# Patient Record
Sex: Female | Born: 1970 | Race: Black or African American | Hispanic: No | Marital: Single | Smoking: Never smoker
Health system: Southern US, Community
[De-identification: ages and names within clinical notes are randomized; demographics above are authoritative.]

## PROBLEM LIST (undated history)

## (undated) ENCOUNTER — Inpatient Hospital Stay (HOSPITAL_COMMUNITY): Payer: Self-pay

## (undated) DIAGNOSIS — B373 Candidiasis of vulva and vagina: Secondary | ICD-10-CM

## (undated) DIAGNOSIS — A599 Trichomoniasis, unspecified: Secondary | ICD-10-CM

## (undated) DIAGNOSIS — R87619 Unspecified abnormal cytological findings in specimens from cervix uteri: Secondary | ICD-10-CM

## (undated) DIAGNOSIS — H269 Unspecified cataract: Secondary | ICD-10-CM

## (undated) DIAGNOSIS — B3731 Acute candidiasis of vulva and vagina: Secondary | ICD-10-CM

## (undated) DIAGNOSIS — B009 Herpesviral infection, unspecified: Secondary | ICD-10-CM

## (undated) DIAGNOSIS — IMO0002 Reserved for concepts with insufficient information to code with codable children: Secondary | ICD-10-CM

## (undated) HISTORY — DX: Unspecified abnormal cytological findings in specimens from cervix uteri: R87.619

## (undated) HISTORY — PX: WISDOM TOOTH EXTRACTION: SHX21

## (undated) HISTORY — DX: Acute candidiasis of vulva and vagina: B37.31

## (undated) HISTORY — PX: DILATION AND CURETTAGE OF UTERUS: SHX78

## (undated) HISTORY — DX: Herpesviral infection, unspecified: B00.9

## (undated) HISTORY — DX: Trichomoniasis, unspecified: A59.9

## (undated) HISTORY — DX: Unspecified cataract: H26.9

## (undated) HISTORY — DX: Candidiasis of vulva and vagina: B37.3

## (undated) HISTORY — DX: Reserved for concepts with insufficient information to code with codable children: IMO0002

## (undated) HISTORY — PX: CATARACT EXTRACTION: SUR2

---

## 2000-05-19 ENCOUNTER — Other Ambulatory Visit: Admission: RE | Admit: 2000-05-19 | Discharge: 2000-05-19 | Payer: Self-pay | Admitting: Obstetrics and Gynecology

## 2000-10-10 ENCOUNTER — Inpatient Hospital Stay (HOSPITAL_COMMUNITY): Admission: AD | Admit: 2000-10-10 | Discharge: 2000-10-13 | Payer: Self-pay | Admitting: Obstetrics and Gynecology

## 2000-10-10 ENCOUNTER — Encounter (INDEPENDENT_AMBULATORY_CARE_PROVIDER_SITE_OTHER): Payer: Self-pay | Admitting: Specialist

## 2004-05-07 ENCOUNTER — Emergency Department (HOSPITAL_COMMUNITY): Admission: EM | Admit: 2004-05-07 | Discharge: 2004-05-07 | Payer: Self-pay | Admitting: Family Medicine

## 2006-02-22 ENCOUNTER — Other Ambulatory Visit: Admission: RE | Admit: 2006-02-22 | Discharge: 2006-02-22 | Payer: Self-pay | Admitting: Obstetrics and Gynecology

## 2008-08-20 ENCOUNTER — Emergency Department (HOSPITAL_COMMUNITY): Admission: EM | Admit: 2008-08-20 | Discharge: 2008-08-21 | Payer: Self-pay | Admitting: Emergency Medicine

## 2010-07-13 ENCOUNTER — Inpatient Hospital Stay (INDEPENDENT_AMBULATORY_CARE_PROVIDER_SITE_OTHER)
Admission: RE | Admit: 2010-07-13 | Discharge: 2010-07-13 | Disposition: A | Discharge status: Home / Self Care | Payer: Self-pay | Source: Ambulatory Visit | Attending: Family Medicine | Admitting: Family Medicine

## 2010-07-13 DIAGNOSIS — K5289 Other specified noninfective gastroenteritis and colitis: Secondary | ICD-10-CM

## 2010-10-03 NOTE — Op Note (Signed)
[facility]  Patient:    LUCERITO, ROSINSKI                        MRN: [id number] Proc. Date: 10/10/00 Adm. Date:  [id number] Attending:  Dierdre Forth [place]                           Operative Report  PREOPERATIVE DIAGNOSES:       1. Intrauterine pregnancy at term.                               2. Active labor.                               3. Prior cesarean section, desire for repeat                                  cesarean section.  POSTOPERATIVE DIAGNOSES:      1. Intrauterine pregnancy at term.                               2. Active labor.                               3. Prior cesarean section, desire for repeat                                  cesarean section.                               4. Macrosomia.  OPERATION:                    Repeat low transverse cesarean section.  SURGEON:                      Vanessa P. Pennie Rushing, M.D.  ASSISTANT:                    Mack Guise, C.N.M.  ANESTHESIA:                   Spinal.  ESTIMATED BLOOD LOSS:         Less than 750 cc.  COMPLICATIONS:                None.  FINDINGS:                     The patient was delivered of a female infant weighing 8 pounds and 14 ounces with Apgars of 6 and 8 at one and five minutes respectively.  There was a nuchal cord and a true knot in the cord.  The uterus, tubes, and ovaries appeared normal for the gravid state.  DESCRIPTION OF PROCEDURE:     The patient was taken to the operating room after appropriate identification and after having had a Foley catheter placed under sterile conditions in the maternity admissions unit.  She was placed on the operating table and a spinal anesthetic placed.  The  patient was then put in the supine position with a left lateral tilt.  The abdomen was prepped with multiple layers of Betadine, and then draped as a sterile field.  After the assurance of adequate anesthesia, a transverse incision was made in the abdomen  at the site of the previous cesarean section incision and the abdomen opened in the layers.  The peritoneum was entered.  The uterus was incised approximately 1 cm above the uterovesical fold, and the infant delivered from the occiput transverse position, having started out in the occiput posterior position.  The vertex was delivered with the aid of a Kiwi vacuum extractor. The nares and pharynx were suctioned with _______ suction.  Meconium-stained fluid had been noted upon entry into the uterus.  The remainder of the infant was delivered and the cord clamped and cut.  The infant was handed off to the awaiting pediatricians.  The appropriate cord blood was drawn and the placenta noted to have detached from the uterus, and was removed with gentle traction. The uterine incision was closed with a running interlocking suture of 0 Vicryl.  An imbricating suture of 0 Vicryl was then placed.  A figure-of-eight suture was placed for adequate hemostasis.  A second row of sutures were then placed in an imbricating fashion with 0 Vicryl.  Copious irrigation was carried out and hemostasis noted to be adequate.  The abdominal peritoneum was closed with a running suture of 2-0 Vicryl.  The rectus muscle was reapproximated in the midline with a figure-of-eight suture of 2-0 Vicryl.  The rectus muscles were irrigated and made hemostatic with the Bovie cautery.  The rectus fascia was closed with a running suture of 0 Vicryl and reinforced on either side of the midline with figure-of-eight sutures of 0 Vicryl.  The subcutaneous tissue was noted to be hemostatic after Bovie cautery and irrigated.  Skin staples were applied to the skin incision.  A sterile dressing was applied.  The patient was taken from the operating room to the recovery room in satisfactory condition having tolerated the procedure well with sponge and instrument counts correct.  The infant went to the full term nursery. DD:   10/10/00 TD:  10/11/00 Job: [id number]

## 2010-10-03 NOTE — Discharge Summary (Signed)
[facility]  Patient:    Linda Campbell, Linda Campbell                        MRN: [id number] Adm. Date:  [id number] Disc. Date: 10/13/00 Attending:  Shaune Spittle Dictator:   Mack Guise, C.N.M.                           Discharge Summary  ADMISSION DIAGNOSES:          1. Term pregnancy in labor.                               2. Prior cesarean section, desires repeat.  DISCHARGE DIAGNOSES:          1. Term pregnancy in labor.                               2. Prior cesarean section, desires repeat.                               3. Macrosomia.  PROCEDURE:                    Low transverse cesarean section.  HISTORY OF PRESENT ILLNESS:   Ms. Horsfall is a 40 year old, gravida 6, para 1-0-4-1, who presents at 40 weeks 1 day in early labor and a history of prior cesarean section requesting repeat.  HOSPITAL COURSE:              She underwent primary low transverse cesarean section by Maris Berger. Haygood, M.D. with the birth of an 8 pound 28 ounce female infant with Apgars of 6 at one minute and 8 at five minutes.  Her postoperative course has been unremarkable.  Her hemoglobin on the first postoperative day was 10.2 and on this, her third postoperative day, she is deemed to be in satisfactory condition for discharge.  DISCHARGE INSTRUCTIONS:       Per [facility] and Gynecology handout.  DISCHARGE MEDICATIONS:        1. Motrin 600 mg p.o. q.6h. p.r.n. pain.                               2. Tylox one to two p.o. q.3-4h. p.r.n. pain.  FOLLOW-UP:                    At [facility] Gyne[facility] in six weeks. DD:  10/13/00 TD:  10/13/00 Job: [id number]

## 2010-10-03 NOTE — H&P (Signed)
[facility]  Patient:    Linda Campbell, Linda Campbell                        MRN: [id number] Adm. Date:  [id number] Attending:  Shaune Spittle Dictator:   Mack Guise, C.N.M.                         History and Physical  HISTORY:                      Linda Campbell is a 40 year old gravida 6, para 1-0-4-1 at 27 1/7 weeks, EDD Oct 09, 2000 who presents with contractions increasing in frequency and intensity through the night.  She reports positive fetal movement.  No bleeding.  No rupture of membranes.  Denies any headache, visual changes, or epigastric pain.  Her pregnancy has been followed by the C.N.M. service at [facility] and is remarkable for previous cesarean section, history of HSV, four ABs, history of abnormal Pap smear, first trimester alcohol, group B strep negative.  This patient was initially evaluated at the office of [facility] on May 19, 2000 at approximately [redacted] weeks gestation.  EDC confirmed by pregnancy ultrasonography.  Patient had previously made decision to have VBAC trial with this pregnancy but on presenting in early labor wishes to repeat her cesarean section delivery.  She is willing to accept the risks of repeat cesarean section, but unwilling to accept the risks of a trial of labor.  Dr. Dierdre Forth was consulted and discussed these issues with the patient in detail and decision was made to proceed with cesarean delivery.  PRENATAL LABORATORIES:        On May 19, 2000:  Hemoglobin and hematocrit 12.2 and 36.3, platelets 230,000.  Blood type and Rh B+.  Antibody screen negative.  VDRL nonreactive.  Rubella immune.  Hepatitis B surface antigen negative.  HIV nonreactive.  Pap smear within normal limits.  GC and chlamydia negative.  AFP/free beta hCG within normal range on July 21, 2000.  One hour glucose challenge 107.  Hemoglobin 11.2.  At 36 weeks culture of the vaginal tract is negative for group B strep.           This patients pregnancy had been unremarkable with the growth of the uterine fundus being compatible in size with her dates throughout the pregnancy.  She has been normotensive with no significant proteinuria.  PAST OBSTETRICAL HISTORY:     1991 induced AB.  1993 induced AB.  1995 SAB with no complications.  In 1998 primary low transverse cesarean delivery of a 7 pounds 12 ounces female infant at 42 weeks for fetal distress.  Babys name is Biagio Borg.  In 2001 induced AB.  Present pregnancy.  PAST MEDICAL HISTORY:         HSV.  PAST SURGICAL HISTORY:        Cesarean section 1998.  FAMILY HISTORY:               Patients mother and maternal aunt with varicose veins.  Maternal uncle with lung CA.  GENETIC HISTORY:              There is no family history of familial or genetic disorders, children that died in infancy or that were born with birth defects.  ALLERGIES:                    No known  drug allergies.  HABITS:                       Patient denies the use of tobacco.  Had alcohol, one mixed drink, in the early trimester.  She has had no other alcoholic beverages since that time.  SOCIAL HISTORY:               Linda Campbell is a single 40 year old African-American female.  The father of the baby, Rupert Stacks, is involved and supportive.  They are of the nondenominational.  REVIEW OF SYSTEMS:            There are no signs or symptoms suggestive of focal or systemic disease.  Patient is typical of one with a uterine pregnancy at term in early labor.  PHYSICAL EXAMINATION  VITAL SIGNS:                  Vital signs stable, afebrile.  HEENT:                        Unremarkable.  NECK:                         Thyroid not enlarged.  HEART:                        Regular rate and rhythm.  LUNGS:                        Clear.  ABDOMEN:                      Gravid in its contour.  Uterine fundus is noted to extend 42 cm above the level of the pubic symphysis.  ______  maneuver finds the infant to be in a longitudinal lie, cephalic presentation, and the estimated fetal weight is 8.5 pounds.  PELVIC:                       Digital examination of the cervix finds it to be 1, thick, approximately 50% ______, and the vertex high and ballottable. Membranes are intact.  EXTREMITIES:                  DTRs are 1+ with no clonus.  There is no significant edema in the extremities.  ASSESSMENT:                   Intrauterine pregnancy at term, previous cesarean section requests repeat cesarean section, consult Dr. Pennie Rushing and admit.  Routine cesarean section orders. DD:  10/10/00 TD:  10/10/00 Job: [id number]

## 2011-07-12 ENCOUNTER — Inpatient Hospital Stay (HOSPITAL_COMMUNITY): Payer: Medicaid Other

## 2011-07-12 ENCOUNTER — Encounter (HOSPITAL_COMMUNITY): Payer: Self-pay

## 2011-07-12 ENCOUNTER — Inpatient Hospital Stay (HOSPITAL_COMMUNITY)
Admission: AD | Admit: 2011-07-12 | Discharge: 2011-07-12 | Disposition: A | Discharge status: Home / Self Care | Payer: Medicaid Other | Source: Ambulatory Visit | Attending: Obstetrics and Gynecology | Admitting: Obstetrics and Gynecology

## 2011-07-12 DIAGNOSIS — O209 Hemorrhage in early pregnancy, unspecified: Secondary | ICD-10-CM

## 2011-07-12 DIAGNOSIS — R109 Unspecified abdominal pain: Secondary | ICD-10-CM | POA: Insufficient documentation

## 2011-07-12 DIAGNOSIS — N898 Other specified noninflammatory disorders of vagina: Secondary | ICD-10-CM | POA: Insufficient documentation

## 2011-07-12 LAB — WET PREP, GENITAL
Clue Cells Wet Prep HPF POC: NONE SEEN
Trich, Wet Prep: NONE SEEN
WBC, Wet Prep HPF POC: NONE SEEN

## 2011-07-12 LAB — CBC
HCT: 34.3 % — ABNORMAL LOW (ref 36.0–46.0)
MCH: 30 pg (ref 26.0–34.0)
MCHC: 33.8 g/dL (ref 30.0–36.0)
MCV: 88.6 fL (ref 78.0–100.0)
RDW: 14.5 % (ref 11.5–15.5)

## 2011-07-12 LAB — ABO/RH: ABO/RH(D): B POS

## 2011-07-12 NOTE — Discharge Instructions (Signed)
Vaginal Bleeding During Pregnancy, First Trimester A small amount of bleeding (spotting) is relatively common in early pregnancy. It usually stops on its own. There are many causes for bleeding or spotting in early pregnancy. Some bleeding may be related to the pregnancy and some may not. Cramping with the bleeding is more serious and concerning. Tell your caregiver if you have any vaginal bleeding.  CAUSES   It is normal in most cases.   The pregnancy ends (miscarriage).   The pregnancy may end (threatened miscarriage).   Infection or inflammation of the cervix.   Growths (polyps) on the cervix.   Pregnancy happens outside of the uterus and in a fallopian tube (tubal pregnancy).   Many tiny cysts in the uterus instead of pregnancy tissue (molar pregnancy).  SYMPTOMS  Vaginal bleeding or spotting with or without cramps. DIAGNOSIS  To evaluate the pregnancy, your caregiver may:  Do a pelvic exam.   Take blood tests.   Do an ultrasound.  It is very important to follow your caregiver's instructions.  TREATMENT   Evaluation of the pregnancy with blood tests and ultrasound.   Bed rest (getting up to use the bathroom only).   Rho-gam immunization if the mother is Rh negative and the father is Rh positive.  HOME CARE INSTRUCTIONS   If your caregiver orders bed rest, you may need to make arrangements for the care of other children and for other responsibilities. However, your caregiver may allow you to continue light activity.   Keep track of the number of pads you use each day, how often you change pads and how soaked (saturated) they are. Write this down.   Do not use tampons. Do not douche.   Do not have sexual intercourse or orgasms until approved by your physician.   Save any tissue that you pass for your caregiver to see.   Take medicine for cramps only with your caregiver's permission.   Do not take aspirin because it can make you bleed.  SEEK IMMEDIATE MEDICAL CARE  IF:   You experience severe cramps in your stomach, back or belly (abdomen).   You have an oral temperature above 102 F (38.9 C), not controlled by medicine.   You pass large clots or tissue.   Your bleeding increases or you become light-headed, weak or have fainting episodes.   You develop chills.   You are leaking or have a gush of fluid from your vagina.   You pass out while having a bowel movement. That may mean you have a ruptured tubal pregnancy.  Document Released: 02/11/2005 Document Revised: 01/14/2011 Document Reviewed: 08/23/2008 Exit[employer] Patient Information 2012 [employer]. 

## 2011-07-12 NOTE — Progress Notes (Signed)
Patient is here with c/o vaginal bleeding and lower abdominal dull pain that started this afternoon. She states that she noticed the spotting after urinating, has no pad or pantiliner. Her last intercourse was a week ago. Patient denies dysuria. She states that she is in a lot of stress at home and its concerned that this is coming from that.

## 2011-07-12 NOTE — ED Provider Notes (Signed)
History   Pt presents today c/o lower abd cramping and vag bleeding. She states she first noticed some dark red bleeding when she was walking in the mall then slightly brighter red about ago. She denies fever, recent intercourse, or any other sx at this time.  Chief Complaint  Patient presents with  . Vaginal Bleeding   HPI  OB History    Grav Para Term Preterm Abortions TAB SAB Ect Mult Living   5 2 2  2 1 1   2       History reviewed. No pertinent past medical history.  Past Surgical History  Procedure Date  . Cesarean section     History reviewed. No pertinent family history.  History  Substance Use Topics  . Smoking status: Never Smoker   . Smokeless tobacco: Not on file  . Alcohol Use: No    Allergies: Allergies not on file  No prescriptions prior to admission    Review of Systems  Constitutional: Negative for fever and chills.  Eyes: Negative for blurred vision and double vision.  Respiratory: Negative for cough.   Cardiovascular: Negative for chest pain and palpitations.  Gastrointestinal: Positive for abdominal pain. Negative for nausea, vomiting, diarrhea and constipation.  Genitourinary: Negative for dysuria, urgency, frequency and hematuria.  Neurological: Negative for dizziness and headaches.  Psychiatric/Behavioral: Negative for depression and suicidal ideas.   Physical Exam   Blood pressure 108/76, pulse 76, temperature 98.6 F (37 C), temperature source Oral, resp. rate 16, last menstrual period 04/04/2011.  Physical Exam  Nursing note and vitals reviewed. Constitutional: She is oriented to person, place, and time. She appears well-developed and well-nourished. No distress.  HENT:  Head: Normocephalic and atraumatic.  Eyes: EOM are normal. Pupils are equal, round, and reactive to light.  GI: Soft. She exhibits no distension and no mass. There is no tenderness. There is no rebound and no guarding.  Genitourinary: There is bleeding around  the vagina. Vaginal discharge found.       Cervix Lg/closed.   Neurological: She is alert and oriented to person, place, and time.  Skin: Skin is warm and dry. She is not diaphoretic.  Psychiatric: She has a normal mood and affect. Her behavior is normal. Thought content normal.    MAU Course  Procedures  Wet prep and GC/Chlamydia cultures done.  Results for orders placed during the hospital encounter of 07/12/11 (from the past 24 hour(s))  ABO/RH     Status: Normal (Preliminary result)   Collection Time   07/12/11  1:41 AM      Component Value Range   ABO/RH(D) B POS    CBC     Status: Abnormal   Collection Time   07/12/11  1:41 AM      Component Value Range   WBC 6.0  4.0 - 10.5 (K/uL)   RBC 3.87  3.87 - 5.11 (MIL/uL)   Hemoglobin 11.6 (*) 12.0 - 15.0 (g/dL)   HCT 86.5 (*) 78.4 - 46.0 (%)   MCV 88.6  78.0 - 100.0 (fL)   MCH 30.0  26.0 - 34.0 (pg)   MCHC 33.8  30.0 - 36.0 (g/dL)   RDW 69.6  29.5 - 28.4 (%)   Platelets 221  150 - 400 (K/uL)  WET PREP, GENITAL     Status: Normal   Collection Time   07/12/11  1:58 AM      Component Value Range   Yeast Wet Prep HPF POC NONE SEEN  NONE SEEN  Trich, Wet Prep NONE SEEN  NONE SEEN    Clue Cells Wet Prep HPF POC NONE SEEN  NONE SEEN    WBC, Wet Prep HPF POC NONE SEEN  NONE SEEN    US Ob Comp Less 14 Wks  07/12/2011  *RADIOLOGY REPORT*  Clinical Data: bleeding; ;  OBSTETRIC <14 WK [place] AND TRANSVAGINAL OB US  Technique: Both transabdominal and transvaginal ultrasound examinations were performed for complete evaluation of the gestation as well as the maternal uterus, adnexal regions, and pelvic cul-de-sac.  Comparison: None  Findings: There is a single intrauterine gestation.  Mean sac diameter is 2.7 cm for an estimated gestational age of [redacted] weeks 6 days.  No yolk sac or fetal pole is visualized.  Small subchorionic hemorrhage is noted.  Multiple fibroids, the largest measuring up to 1.6 cm.  Small left corpus luteal cyst.  Right adnexa  unremarkable.  No free fluid.  IMPRESSION: Intrauterine pregnancy, 7 weeks 6 days by mean sac diameter. However, no yolk sac or fetal pole are visualized.  Findings concerning for anembryonic pregnancy.  Consider follow-up with serial quantitative beta HCG and ultrasounds to confirm.  Original Report Authenticated By: Cyndie Chime, M.D.   US Ob Transvaginal  07/12/2011  *RADIOLOGY REPORT*  Clinical Data: bleeding; ;  OBSTETRIC <14 WK [place] AND TRANSVAGINAL OB US  Technique: Both transabdominal and transvaginal ultrasound examinations were performed for complete evaluation of the gestation as well as the maternal uterus, adnexal regions, and pelvic cul-de-sac.  Comparison: None  Findings: There is a single intrauterine gestation.  Mean sac diameter is 2.7 cm for an estimated gestational age of [redacted] weeks 6 days.  No yolk sac or fetal pole is visualized.  Small subchorionic hemorrhage is noted.  Multiple fibroids, the largest measuring up to 1.6 cm.  Small left corpus luteal cyst.  Right adnexa unremarkable.  No free fluid.  IMPRESSION: Intrauterine pregnancy, 7 weeks 6 days by mean sac diameter. However, no yolk sac or fetal pole are visualized.  Findings concerning for anembryonic pregnancy.  Consider follow-up with serial quantitative beta HCG and ultrasounds to confirm.  Original Report Authenticated By: Cyndie Chime, M.D.     Assessment and Plan  Bleeding in preg: discussed with pt at length. Will draw B-quant today and will repeat in 2 days. Discussed diet, activity, risks, and precautions.   Clinton Gallant. Lillian Tigges III, DrHSc, MPAS, PA-C  07/12/2011, 1:32 AM   Henrietta Hoover, PA 07/12/11 [phone]

## 2011-07-13 NOTE — ED Provider Notes (Signed)
Agree with above note.  Linda Campbell 07/13/2011 11:49 AM

## 2011-07-14 ENCOUNTER — Inpatient Hospital Stay (HOSPITAL_COMMUNITY)
Admission: AD | Admit: 2011-07-14 | Discharge: 2011-07-14 | Disposition: A | Discharge status: Home / Self Care | Payer: Medicaid Other | Source: Ambulatory Visit | Attending: Family Medicine | Admitting: Family Medicine

## 2011-07-14 DIAGNOSIS — O020 Blighted ovum and nonhydatidiform mole: Secondary | ICD-10-CM

## 2011-07-14 DIAGNOSIS — O0289 Other abnormal products of conception: Secondary | ICD-10-CM

## 2011-07-14 DIAGNOSIS — O209 Hemorrhage in early pregnancy, unspecified: Secondary | ICD-10-CM | POA: Insufficient documentation

## 2011-07-14 LAB — HCG, QUANTITATIVE, PREGNANCY: hCG, Beta Chain, Quant, S: 2115 m[IU]/mL — ABNORMAL HIGH (ref <5)

## 2011-07-14 MED ORDER — HYDROCODONE-ACETAMINOPHEN 5-500 MG PO TABS: 1.0000 | ORAL_TABLET | Freq: Four times a day (QID) | ORAL | Status: AC | PRN

## 2011-07-14 MED ORDER — PROMETHAZINE HCL 25 MG PO TABS: 25.0000 mg | ORAL_TABLET | Freq: Four times a day (QID) | ORAL | Status: AC | PRN

## 2011-07-14 MED ORDER — MISOPROSTOL 200 MCG PO TABS: ORAL_TABLET | ORAL | Status: DC

## 2011-07-14 NOTE — ED Provider Notes (Signed)
History   Pt presents today for repeat B-quant secondary to bleeding in preg with suspected anembryonic preg. She states she is doing well and denies any pain at this time. She continues to have mild vag spotting.  Chief Complaint  Patient presents with  . Follow-up   HPI  OB History    Grav Para Term Preterm Abortions TAB SAB Ect Mult Living   5 2 2  2 1 1   2       No past medical history on file.  Past Surgical History  Procedure Date  . Cesarean section     No family history on file.  History  Substance Use Topics  . Smoking status: Never Smoker   . Smokeless tobacco: Not on file  . Alcohol Use: No    Allergies: No Known Allergies  Prescriptions prior to admission  Medication Sig Dispense Refill  . acetaminophen (TYLENOL) 325 MG tablet Take 325 mg by mouth every 6 (six) hours as needed. For pain      . Prenatal Vit-Fe Fumarate-FA (PRENATAL MULTIVITAMIN) TABS Take 1 tablet by mouth daily.        Review of Systems  Constitutional: Negative for fever and chills.  Eyes: Negative for blurred vision and double vision.  Respiratory: Negative for cough, hemoptysis, sputum production, shortness of breath and wheezing.   Cardiovascular: Negative for chest pain and palpitations.  Gastrointestinal: Negative for nausea, vomiting, abdominal pain, diarrhea and constipation.  Genitourinary: Negative for dysuria, urgency, frequency and hematuria.  Neurological: Negative for dizziness and headaches.  Psychiatric/Behavioral: Negative for depression and suicidal ideas.   Physical Exam   Blood pressure 111/68, pulse 95, temperature 98 F (36.7 C), temperature source Oral, resp. rate 16, last menstrual period 04/04/2011, SpO2 100.00%.  Physical Exam  Nursing note and vitals reviewed. Constitutional: She is oriented to person, place, and time. She appears well-developed and well-nourished. No distress.  HENT:  Head: Normocephalic and atraumatic.  Eyes: EOM are normal. Pupils  are equal, round, and reactive to light.  GI: Soft. She exhibits no distension and no mass. There is no tenderness. There is no rebound and no guarding.  Neurological: She is alert and oriented to person, place, and time.  Skin: Skin is warm and dry. She is not diaphoretic.  Psychiatric: She has a normal mood and affect. Her behavior is normal. Judgment and thought content normal.    MAU Course  Procedures  Results for orders placed during the hospital encounter of 07/14/11 (from the past 72 hour(s))  HCG, QUANTITATIVE, PREGNANCY     Status: Abnormal   Collection Time   07/14/11  3:44 PM      Component Value Range Comment   hCG, Beta Chain, Quant, S 2115 (*) <5 (mIU/mL)      Assessment and Plan  Anembryonic preg: discussed with pt at length. Discussed tx options including expectant management vs cytotec. Pt desires cytotec. Will give Rx for cytotec, lortab, and phenergan. Discussed possibility of heavy vag bleeding and indications for return to the MAU. Discussed diet, activity, risks, and precautions.  Clinton Gallant. Sherilynn Dieu III, DrHSc, MPAS, PA-C  07/14/2011, 5:39 PM   Henrietta Hoover, PA 07/14/11 1746

## 2011-07-14 NOTE — ED Provider Notes (Signed)
Chart reviewed and agree with management and plan.  

## 2011-07-14 NOTE — ED Notes (Signed)
Pt discharged home with instructions given by E. Rice PA. Pt also discharged  With comfort materials poem, resources and pilow.

## 2011-07-14 NOTE — Progress Notes (Signed)
Patient to MAU for repeat BHCG. Denies any pain but has light spotting.

## 2011-09-21 ENCOUNTER — Encounter: Payer: Self-pay | Admitting: Obstetrics and Gynecology

## 2011-09-30 ENCOUNTER — Ambulatory Visit (INDEPENDENT_AMBULATORY_CARE_PROVIDER_SITE_OTHER): Payer: Medicaid Other | Admitting: Obstetrics and Gynecology

## 2011-09-30 ENCOUNTER — Encounter: Payer: Self-pay | Admitting: Obstetrics and Gynecology

## 2011-09-30 VITALS — BP 100/70 | Temp 98.2°F | Resp 16 | Ht 60.0 in | Wt 150.0 lb

## 2011-09-30 DIAGNOSIS — Z309 Encounter for contraceptive management, unspecified: Secondary | ICD-10-CM

## 2011-09-30 DIAGNOSIS — Z0000 Encounter for general adult medical examination without abnormal findings: Secondary | ICD-10-CM

## 2011-09-30 DIAGNOSIS — IMO0001 Reserved for inherently not codable concepts without codable children: Secondary | ICD-10-CM | POA: Insufficient documentation

## 2011-09-30 DIAGNOSIS — IMO0002 Reserved for concepts with insufficient information to code with codable children: Secondary | ICD-10-CM

## 2011-09-30 DIAGNOSIS — B009 Herpesviral infection, unspecified: Secondary | ICD-10-CM | POA: Insufficient documentation

## 2011-09-30 MED ORDER — VALACYCLOVIR HCL 1 G PO TABS: 1000.0000 mg | ORAL_TABLET | Freq: Two times a day (BID) | ORAL | Status: AC

## 2011-09-30 NOTE — Patient Instructions (Addendum)

## 2011-09-30 NOTE — Progress Notes (Signed)
Last Pap: per pt  06/2011 "at [facility] hospital" pt had a miscarriage in Feb.2013.  Treated with cytotec with passage of tissue WNL: Yes Regular Periods:yes Contraception: Condoms  Monthly Breast exam:no Tetanus<62yrs:no Nl.Bladder Function:yes Daily BMs:yes Healthy Diet:no Calcium:no Mammogram:no Exercise:yes Seatbelt: yes Abuse at home: no Stressful work:no Sigmoid-colonoscopy: per pt never Bone Density: No  Subjective:    Linda Campbell is a 41 y.o. female, [id number], who presents for an annual exam, and to discuss IUD     History   Social History  . Marital Status: Single    Spouse Name: N/A    Number of Children: N/A  . Years of Education: N/A   Social History Main Topics  . Smoking status: Never Smoker   . Smokeless tobacco: None  . Alcohol Use: No  . Drug Use: No  . Sexually Active: Yes    Birth Control/ Protection: Condom   Other Topics Concern  . None   Social History Narrative  . None    Menstrual cycle:   LMP: Patient's last menstrual period was 09/19/2010.           Cycle: Regular, monthly with normal flow and no severe dysmenorrha  The following portions of the patient's history were reviewed and updated as appropriate: allergies, current medications, past family history, past medical history, past social history, past surgical history and problem list.  Review of Systems Pertinent items are noted in HPI. 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 No    Weight:  Wt Readings from Last 1 Encounters:  09/30/11 150 lb (68.04 kg)          BMI: Body mass index is 29.30 kg/(m^2).  General Appearance: Alert, appropriate  appearance for age. No acute distress HEENT: Grossly normal Neck / Thyroid: Supple, no masses, nodes or enlargement Lungs: clear to auscultation bilaterally Back: No CVA tenderness Breast Exam: No masses or nodes.No dimpling, nipple retraction or discharge. Cardiovascular: Regular rate and rhythm. S1, S2, no murmur Gastrointestinal: Soft, non-tender, no masses or organomegaly Pelvic Exam: External genitalia: normal general appearance Vaginal: normal mucosa without prolapse or lesions Cervix: normal appearance Adnexa: normal bimanual exam Uterus: irregular enlargement and 8-10 wks size Rectovaginal: normal rectal, no masses Lymphatic Exam: Non-palpable nodes in neck, clavicular, axillary, or inguinal regions Skin: no rash or abnormalities Neurologic: Normal gait and speech, no tremor  Psychiatric: Alert and oriented, appropriate affect.   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

## 2011-10-01 LAB — GC/CHLAMYDIA PROBE AMP, GENITAL
Chlamydia, DNA Probe: NEGATIVE
GC Probe Amp, Genital: NEGATIVE

## 2011-11-02 ENCOUNTER — Encounter: Payer: Self-pay | Admitting: Obstetrics and Gynecology

## 2011-11-02 ENCOUNTER — Ambulatory Visit (INDEPENDENT_AMBULATORY_CARE_PROVIDER_SITE_OTHER): Payer: Medicaid Other | Admitting: Obstetrics and Gynecology

## 2011-11-02 VITALS — BP 94/62 | HR 60 | Wt 150.0 lb

## 2011-11-02 DIAGNOSIS — D759 Disease of blood and blood-forming organs, unspecified: Secondary | ICD-10-CM

## 2011-11-02 DIAGNOSIS — B009 Herpesviral infection, unspecified: Secondary | ICD-10-CM | POA: Insufficient documentation

## 2011-11-02 DIAGNOSIS — R6889 Other general symptoms and signs: Secondary | ICD-10-CM

## 2011-11-02 DIAGNOSIS — IMO0002 Reserved for concepts with insufficient information to code with codable children: Secondary | ICD-10-CM | POA: Insufficient documentation

## 2011-11-02 DIAGNOSIS — Z30430 Encounter for insertion of intrauterine contraceptive device: Secondary | ICD-10-CM

## 2011-11-02 DIAGNOSIS — Z309 Encounter for contraceptive management, unspecified: Secondary | ICD-10-CM

## 2011-11-02 DIAGNOSIS — B373 Candidiasis of vulva and vagina: Secondary | ICD-10-CM | POA: Insufficient documentation

## 2011-11-02 DIAGNOSIS — B3731 Acute candidiasis of vulva and vagina: Secondary | ICD-10-CM | POA: Insufficient documentation

## 2011-11-02 DIAGNOSIS — IMO0001 Reserved for inherently not codable concepts without codable children: Secondary | ICD-10-CM

## 2011-11-02 LAB — POCT URINE PREGNANCY: Preg Test, Ur: NEGATIVE

## 2011-11-02 MED ORDER — LEVONORGESTREL 20 MCG/24HR IU IUD
INTRAUTERINE_SYSTEM | Freq: Once | INTRAUTERINE | Status: AC
  Administered 2011-11-02: 1 via INTRAUTERINE

## 2011-11-02 NOTE — Addendum Note (Signed)
Addended byWinfred Leeds on: 11/02/2011 12:23 PM   Modules accepted: Orders

## 2011-11-02 NOTE — Progress Notes (Signed)
IUD INSERTION NOTE  Levette A Caslin is a 41 y.o. female [phone] who presents for IUD insertion.  Patient used cytotec 400 mcg prior to procedure.  Consent signed after risks and benefits were reviewed including but not limited to bleeding, infection, expulsion and risk of uterine perforation that may require an additional procedure for removal.  LMP: Patient's last menstrual period was 10/22/2011. UPT: negative  MIRENA LOT NUMBER:[id number]   Prepped with Betadine Tenaculum placed on anterior lip of cervix after Hurricane gel was applied Uterus sounded at  7 cm Insertion of MIRENA IUD per protocol without any complications   Assessment:  IUD Insertion                        Previous history of IUD  Plan:  1. Patient instructed to call with oral temperature of 100.4 degrees Fahrenheit or more, excessive bleeding or pain that is not relieved with OTC analgesia taken as directed  2. Patient instructed on how  to check IUD strings and encouraged to do so after each menstrual cycle  3. Advised not to place anything in vagina or have sexual intercourse for 7 days  4. Follow-up: 4 weeks   Karlyn Glasco PA-C 11/02/2011 10:25 AM

## 2011-11-02 NOTE — Patient Instructions (Signed)
Keep appointment for follow up in 4 weeks  Call [facility] [phone]:  -for temperature of 100.4 degrees Fahrenheit or more -pain not improved with over the counter pain medications (Ibuprofen, Advil, Aleve,        Tylenol or acetaminophen) -for excessive bleeding (more than a usual period) -for any other concerns  Do not place anything in your vagina for the next 7 days  

## 2011-11-30 ENCOUNTER — Ambulatory Visit (INDEPENDENT_AMBULATORY_CARE_PROVIDER_SITE_OTHER): Payer: Medicaid Other | Admitting: Obstetrics and Gynecology

## 2011-11-30 ENCOUNTER — Encounter: Payer: Self-pay | Admitting: Obstetrics and Gynecology

## 2011-11-30 VITALS — BP 100/62 | HR 70 | Wt 145.0 lb

## 2011-11-30 DIAGNOSIS — Z30431 Encounter for routine checking of intrauterine contraceptive device: Secondary | ICD-10-CM

## 2011-11-30 LAB — POCT URINE PREGNANCY: Preg Test, Ur: NEGATIVE

## 2011-11-30 NOTE — Progress Notes (Signed)
41 YO with recent IUD insertion returns for follow up.  Denies any complaints except that partner feels string-request trim.  O:  UPT-negative  Pelvic: EGBUS-wnl, vagina-scant brown discharge,  cervix-string visible-trimmed to 1 cm, uterus-no tenderness, adnexae-no masses or tenderness  A: IUD Check-string trimmed  P: RTO-AEx or prn  Erendira Crabtree, PA-C

## 2013-07-06 IMAGING — US US OB COMP LESS 14 WK
1 series · 14 of 28 positions shown · non-contrast
Comparison: None

CLINICAL DATA: bleeding; ;

OBSTETRIC <14 WK US AND TRANSVAGINAL OB US
TECHNIQUE: Both transabdominal and transvaginal ultrasound
examinations were performed for complete evaluation of the
gestation as well as the maternal uterus, adnexal regions, and
pelvic cul-de-sac.

[Series 1: us ob comp +14 wk · 47 acquisitions, 14 frames shown]
[im 2/47]
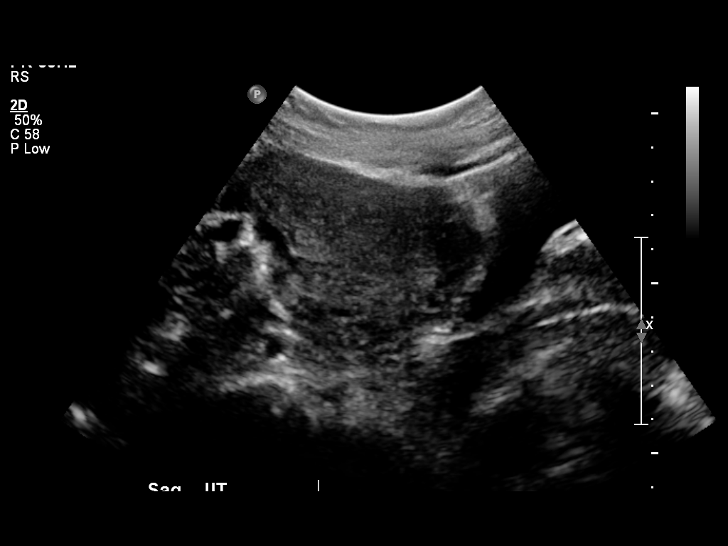
[im 6/47]
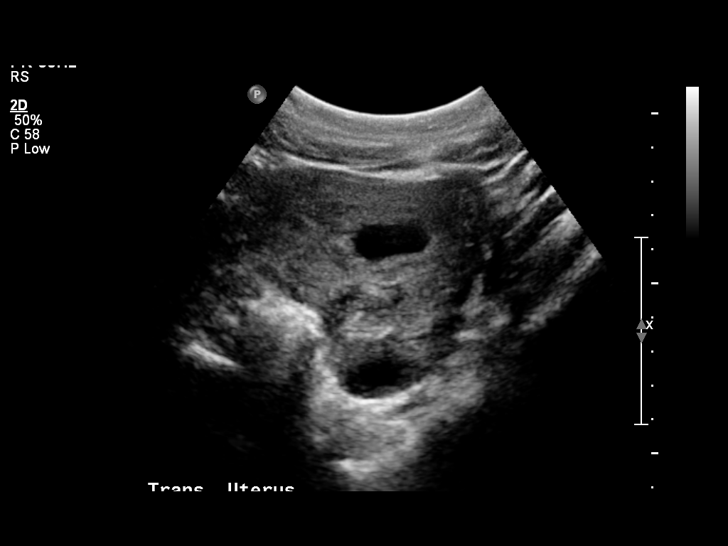
[im 9/47]
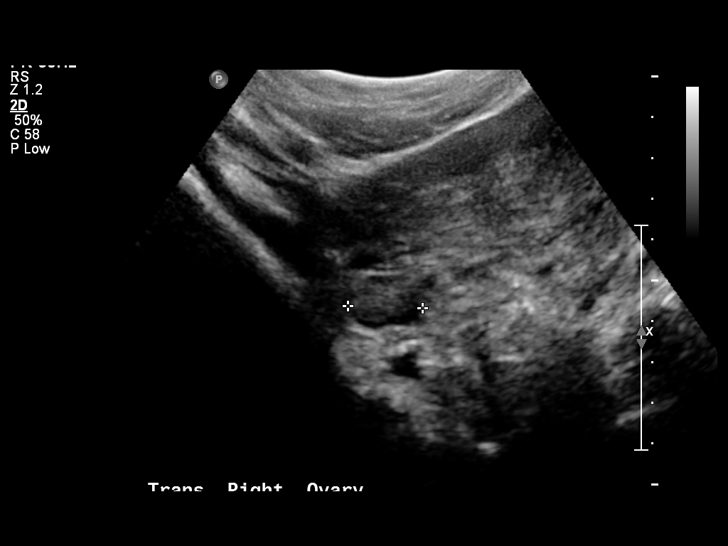
[im 12/47]
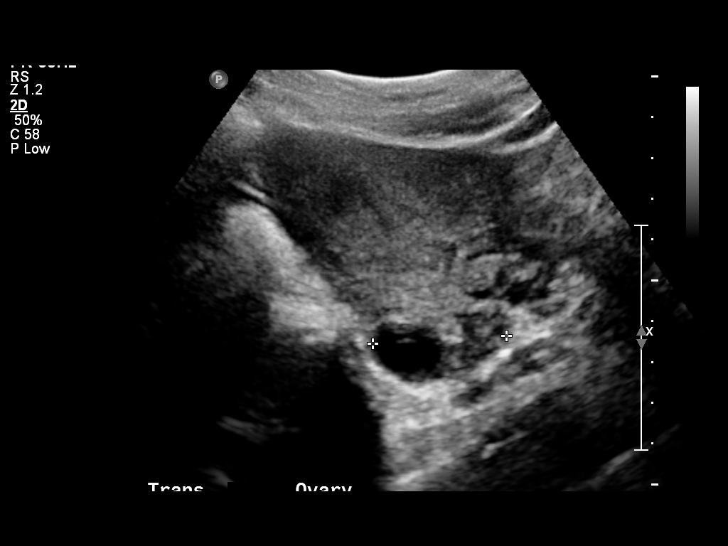
[im 16/47]
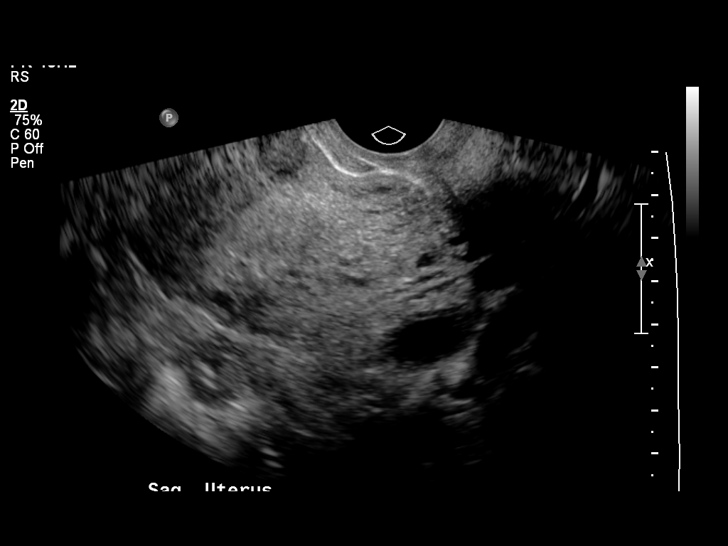
[im 19/47]
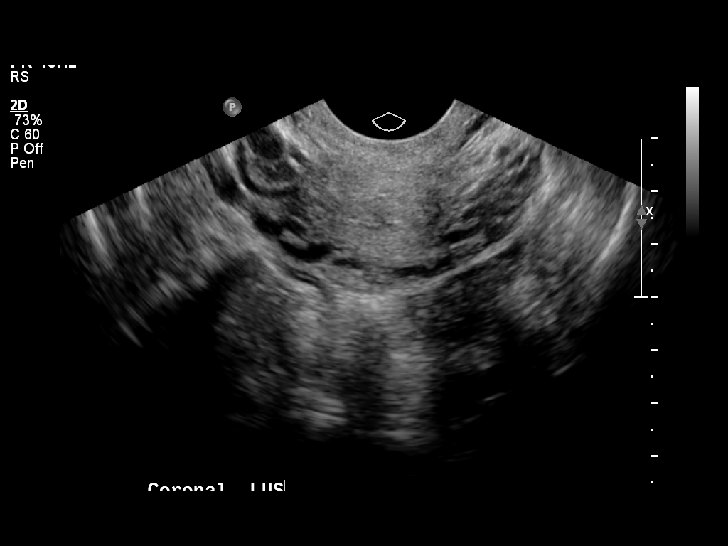
[im 23/47]
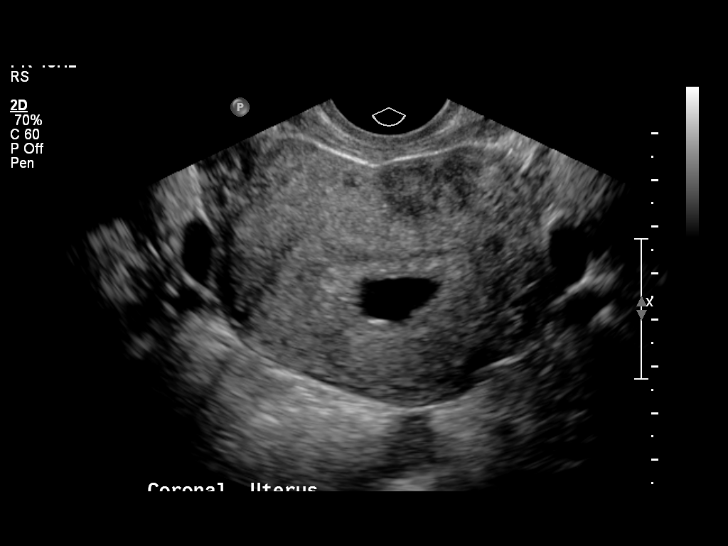
[im 26/47]
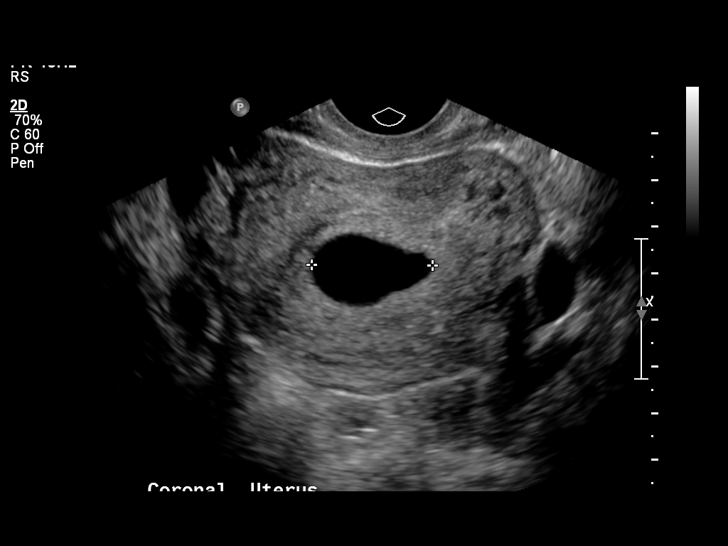
[im 29/47]
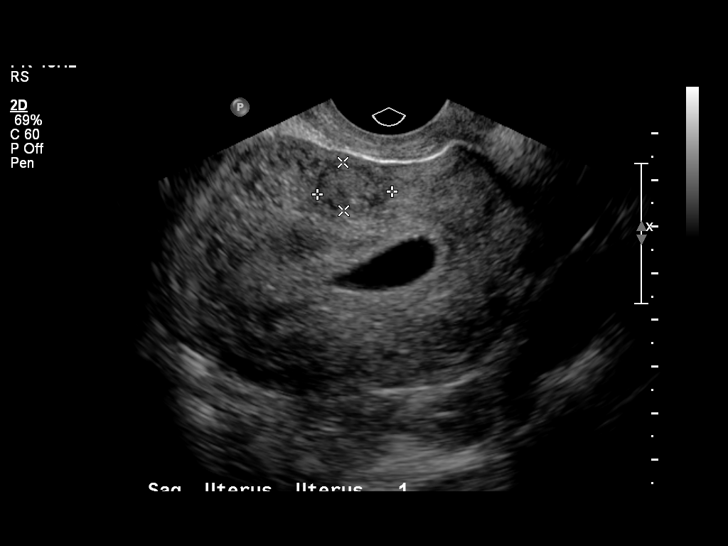
[im 33/47]
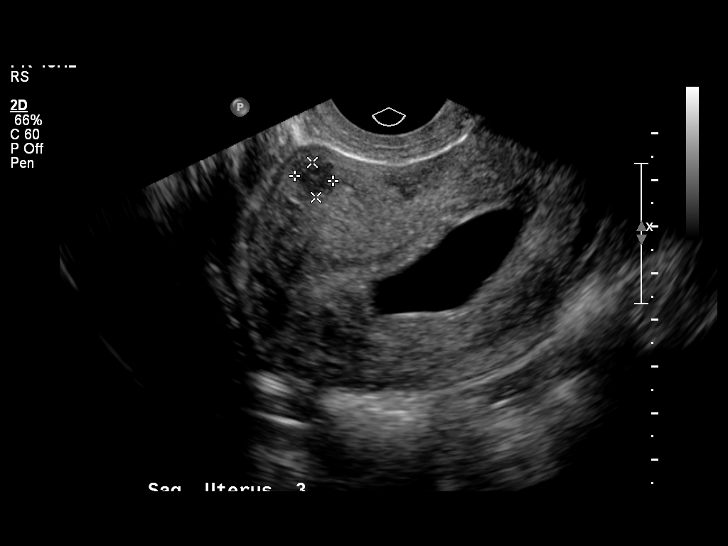
[im 36/47]
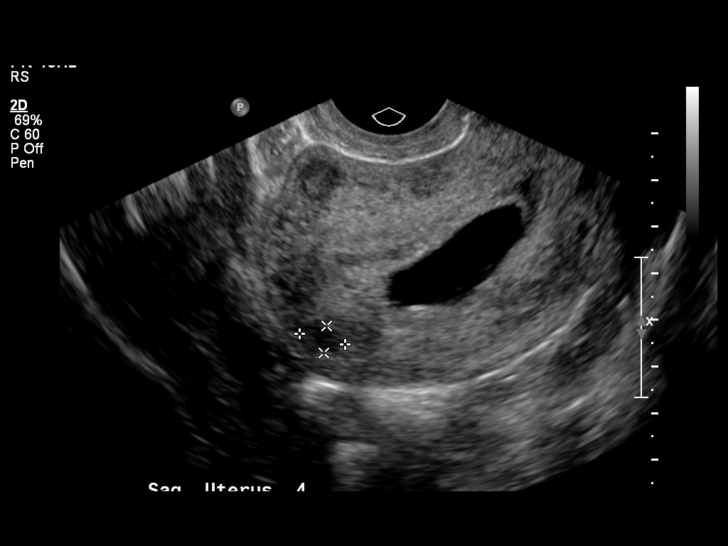
[im 40/47]
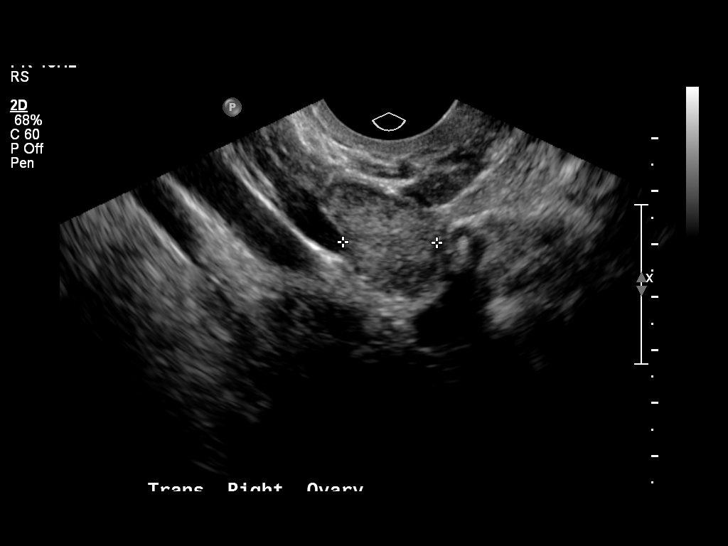
[im 43/47]
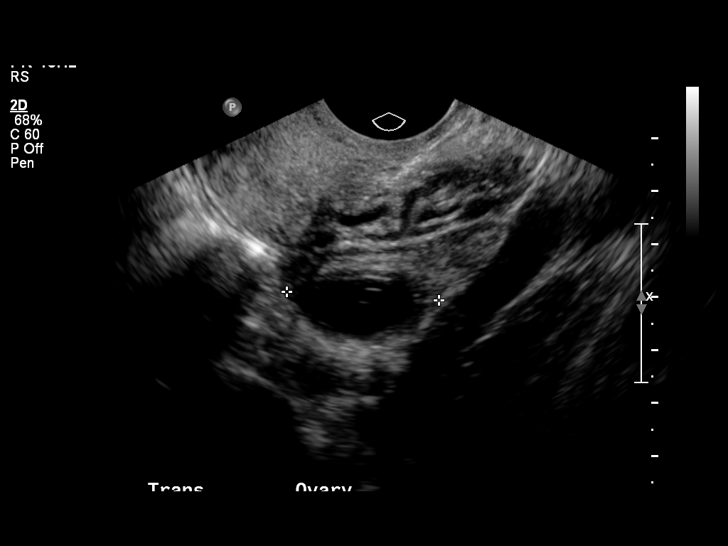
[im 47/47]
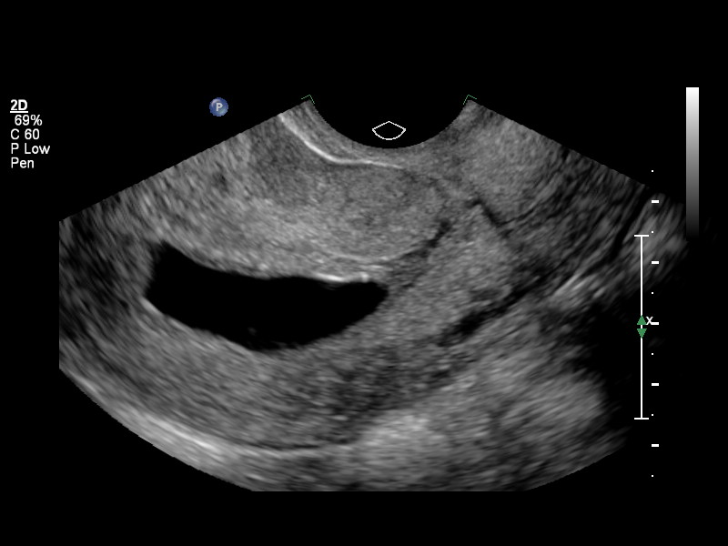

[14 of 28 positions shown; findings below may reference images not displayed]

FINDINGS: There is a single intrauterine gestation.  Mean sac
diameter is 2.7 cm for an estimated gestational age of 7 weeks 6
days.  No yolk sac or fetal pole is visualized.

Small subchorionic hemorrhage is noted.  Multiple fibroids, the
largest measuring up to 1.6 cm.

Small left corpus luteal cyst.  Right adnexa unremarkable.  No free
fluid.
IMPRESSION: Intrauterine pregnancy, 7 weeks 6 days by mean sac diameter.
However, no yolk sac or fetal pole are visualized.  Findings
concerning for anembryonic pregnancy.  Consider follow-up with
serial quantitative beta HCG and ultrasounds to confirm.

## 2014-03-19 ENCOUNTER — Encounter: Payer: Self-pay | Admitting: Obstetrics and Gynecology

## 2016-01-20 ENCOUNTER — Encounter (HOSPITAL_COMMUNITY): Payer: Self-pay | Admitting: *Deleted

## 2016-01-20 ENCOUNTER — Ambulatory Visit (HOSPITAL_COMMUNITY)
Admission: EM | Admit: 2016-01-20 | Discharge: 2016-01-20 | Disposition: A | Discharge status: Home / Self Care | Payer: Medicaid Other | Attending: Family Medicine | Admitting: Family Medicine

## 2016-01-20 DIAGNOSIS — B349 Viral infection, unspecified: Secondary | ICD-10-CM

## 2016-01-20 DIAGNOSIS — K2970 Gastritis, unspecified, without bleeding: Secondary | ICD-10-CM

## 2016-01-20 LAB — POCT I-STAT, CHEM 8
BUN: 16 mg/dL (ref 6–20)
CALCIUM ION: 1.22 mmol/L (ref 1.15–1.40)
CHLORIDE: 100 mmol/L — AB (ref 101–111)
CREATININE: 0.7 mg/dL (ref 0.44–1.00)
GLUCOSE: 104 mg/dL — AB (ref 65–99)
HCT: 45 % (ref 36.0–46.0)
Hemoglobin: 15.3 g/dL — ABNORMAL HIGH (ref 12.0–15.0)
Potassium: 3.7 mmol/L (ref 3.5–5.1)
Sodium: 138 mmol/L (ref 135–145)
TCO2: 25 mmol/L (ref 0–100)

## 2016-01-20 MED ORDER — ONDANSETRON 4 MG PO TBDP
4.0000 mg | ORAL_TABLET | Freq: Once | ORAL | Status: AC
  Administered 2016-01-20: 4 mg via ORAL

## 2016-01-20 MED ORDER — ONDANSETRON HCL 4 MG PO TABS: 4.0000 mg | ORAL_TABLET | Freq: Four times a day (QID) | ORAL | 0 refills | Status: DC

## 2016-01-20 MED ORDER — ACETAMINOPHEN 325 MG PO TABS
650.0000 mg | ORAL_TABLET | Freq: Once | ORAL | Status: AC
  Administered 2016-01-20: 650 mg via ORAL

## 2016-01-20 MED ORDER — ACETAMINOPHEN 325 MG PO TABS
ORAL_TABLET | ORAL | Status: AC
  Filled 2016-01-20: qty 2

## 2016-01-20 MED ORDER — ONDANSETRON 4 MG PO TBDP
ORAL_TABLET | ORAL | Status: AC
  Filled 2016-01-20: qty 1

## 2016-01-20 NOTE — ED Notes (Signed)
C/O stomach ache, generalized body aches, continued nausea.  Acetaminophen provided for comfort.  Instructed on small, frequent sips of PO fluids.  Instructed to go to ED if unable to keep small frequent sips of fluids down or if abd pain worsens or localizes.  Pt & family member state they are comfortable going home @ this time.

## 2016-01-20 NOTE — ED Triage Notes (Signed)
Pt     Reports    Vomiting     Chills   And  Body  Aches     With  Onset  Of  Symptoms      X  2  Days    Pt  Reports    Vomited  X  3  As  Well

## 2016-01-20 NOTE — ED Provider Notes (Signed)
CSN: [id number]     Arrival date & time 01/20/16  1014 History   First MD Initiated Contact with Patient 01/20/16 1137     Chief Complaint  Patient presents with  . Generalized Body Aches   (Consider location/radiation/quality/duration/timing/severity/associated sxs/prior Treatment) 45 year old female presents to the urgent care stating that yesterday morning she developed chills, body aches, myalgias and "lots of vomiting". She states she has vomited about 17 times over the past 1-1/2 days. She is vomited 4 times this morning with the last episode around 7:00. She is complaining of generalized abdominal pain that radiates to the back. States she has had normal bowel movements in the last few days. Denies diarrhea. Denies upper respiratory symptoms, sore throat earache, cough or shortness of breath.  She states last evening she was sitting on the toilet and she had a syncopal episode and she is woke a little later on the floor.      Past Medical History:  Diagnosis Date  . Abnormal Pap smear "1998"  . Herpes   . Trichomonas   . Vaginal yeast infection    Past Surgical History:  Procedure Laterality Date  . CESAREAN SECTION  1998 and 2002  . DILATION AND CURETTAGE OF UTERUS    . WISDOM TOOTH EXTRACTION     Family History  Problem Relation Age of Onset  . Alzheimer's disease Maternal Grandfather    Social History  Substance Use Topics  . Smoking status: Never Smoker  . Smokeless tobacco: Never Used  . Alcohol use Yes     Comment: social   OB History    Gravida Para Term Preterm AB Living   6 2 2   2 2    SAB TAB Ectopic Multiple Live Births   1 1           Review of Systems  Constitutional: Positive for activity change and chills. Negative for fever.  HENT: Negative.   Eyes: Negative.   Respiratory: Negative for cough, chest tightness and shortness of breath.   Cardiovascular: Negative for chest pain and leg swelling.  Gastrointestinal: Positive for abdominal pain,  nausea and vomiting. Negative for blood in stool, constipation and diarrhea.  Genitourinary: Negative.  Negative for dysuria, frequency and pelvic pain.  Musculoskeletal: Positive for back pain and myalgias. Negative for joint swelling.  Skin: Negative.   Neurological: Positive for syncope. Negative for tremors, seizures, facial asymmetry, speech difficulty and numbness.  Psychiatric/Behavioral: Negative.     Allergies  Other; Shellfish allergy; and Pollen extract  Home Medications   Prior to Admission medications   Medication Sig Start Date End Date Taking? Authorizing Provider  acetaminophen (TYLENOL) 325 MG tablet Take 325 mg by mouth every 6 (six) hours as needed. For pain    Historical Provider, MD  misoprostol (CYTOTEC) 200 MCG tablet Take all four tabs by mouth as a single dose. 07/14/11   Clinton Gallant Rice, PA-C  ondansetron (ZOFRAN) 4 MG tablet Take 1 tablet (4 mg total) by mouth every 6 (six) hours. 01/20/16   Hayden Rasmussen, NP  Prenatal Vit-Fe Fumarate-FA (PRENATAL MULTIVITAMIN) TABS Take 1 tablet by mouth daily.    Historical Provider, MD  valACYclovir (VALTREX) 500 MG tablet Take 500 mg by mouth 2 (two) times daily.    Historical Provider, MD   Meds Ordered and Administered this Visit   Medications  ondansetron (ZOFRAN-ODT) disintegrating tablet 4 mg (4 mg Oral Given 01/20/16 1204)    BP 144/98 (BP Location: Left Arm)   Pulse 60  Temp 98.6 F (37 C) (Oral)   Resp 16   SpO2 100%  Orthostatic VS for the past 24 hrs:  BP- Lying Pulse- Lying BP- Sitting Pulse- Sitting BP- Standing at 0 minutes Pulse- Standing at 0 minutes  01/20/16 1209 148/85 61 130/79 73 136/84 74    Physical Exam  Urgent Care Course   Clinical Course    Procedures (including critical care time)  Labs Review Labs Reviewed  POCT I-STAT, CHEM 8 - Abnormal; Notable for the following:       Result Value   Chloride 100 (*)    Glucose, Bld 104 (*)    Hemoglobin 15.3 (*)    All other components within  normal limits    Imaging Review No results found.   Visual Acuity Review  Right Eye Distance:   Left Eye Distance:   Bilateral Distance:    Right Eye Near:   Left Eye Near:    Bilateral Near:         MDM   1. Viral illness   2. Viral gastritis    Start drinking Pedialyte and water for rehydration. Slowly advance diet as tolerated. See medical attention if worse. Meds ordered this encounter  Medications  . ondansetron (ZOFRAN-ODT) disintegrating tablet 4 mg  . ondansetron (ZOFRAN) 4 MG tablet    Sig: Take 1 tablet (4 mg total) by mouth every 6 (six) hours.    Dispense:  12 tablet    Refill:  0    Order Specific Question:   Supervising Provider    Answer:   Linna HoffKINDL, JAMES D [[id number]]   At time of discharge patient had received Zofran and was feeling better. She states that her nausea was much improved. Labs and vital signs were reviewed. She has had no vomiting in the urgent care. She will be discharged in a stable condition with the above medication and instructions. No work for [employer].    Hayden Rasmussenavid Kashia Brossard, NP 01/20/16 1303    Hayden Rasmussenavid Jhane Lorio, NP 01/20/16 [phone]

## 2016-01-20 NOTE — Discharge Instructions (Addendum)
Start drinking Pedialyte and water for rehydration. Slowly advance diet as tolerated.

## 2017-07-30 ENCOUNTER — Encounter (HOSPITAL_COMMUNITY): Payer: Self-pay | Admitting: Emergency Medicine

## 2017-07-30 ENCOUNTER — Ambulatory Visit (HOSPITAL_COMMUNITY)
Admission: EM | Admit: 2017-07-30 | Discharge: 2017-07-30 | Disposition: A | Discharge status: Home / Self Care | Payer: BLUE CROSS/BLUE SHIELD | Attending: Family Medicine | Admitting: Family Medicine

## 2017-07-30 DIAGNOSIS — R112 Nausea with vomiting, unspecified: Secondary | ICD-10-CM | POA: Diagnosis not present

## 2017-07-30 DIAGNOSIS — Z3202 Encounter for pregnancy test, result negative: Secondary | ICD-10-CM | POA: Diagnosis not present

## 2017-07-30 DIAGNOSIS — R197 Diarrhea, unspecified: Secondary | ICD-10-CM | POA: Diagnosis not present

## 2017-07-30 DIAGNOSIS — M791 Myalgia, unspecified site: Secondary | ICD-10-CM

## 2017-07-30 DIAGNOSIS — M545 Low back pain: Secondary | ICD-10-CM

## 2017-07-30 LAB — POCT URINALYSIS DIP (DEVICE)
BILIRUBIN URINE: NEGATIVE
GLUCOSE, UA: NEGATIVE mg/dL
Ketones, ur: 40 mg/dL — AB
LEUKOCYTES UA: NEGATIVE
Nitrite: NEGATIVE
Protein, ur: >=300 mg/dL — AB
UROBILINOGEN UA: 0.2 mg/dL (ref 0.0–1.0)
pH: 6 (ref 5.0–8.0)

## 2017-07-30 LAB — POCT PREGNANCY, URINE: Preg Test, Ur: NEGATIVE

## 2017-07-30 MED ORDER — ONDANSETRON 4 MG PO TBDP
ORAL_TABLET | ORAL | Status: AC
  Filled 2017-07-30: qty 1

## 2017-07-30 MED ORDER — KETOROLAC TROMETHAMINE 60 MG/2ML IM SOLN
60.0000 mg | Freq: Once | INTRAMUSCULAR | Status: AC
  Administered 2017-07-30: 60 mg via INTRAMUSCULAR

## 2017-07-30 MED ORDER — KETOROLAC TROMETHAMINE 60 MG/2ML IM SOLN
INTRAMUSCULAR | Status: AC
  Filled 2017-07-30: qty 2

## 2017-07-30 MED ORDER — ONDANSETRON 4 MG PO TBDP: 4.0000 mg | ORAL_TABLET | Freq: Three times a day (TID) | ORAL | 0 refills | Status: DC | PRN

## 2017-07-30 MED ORDER — DICYCLOMINE HCL 20 MG PO TABS: 20.0000 mg | ORAL_TABLET | Freq: Two times a day (BID) | ORAL | 0 refills | Status: DC

## 2017-07-30 MED ORDER — ONDANSETRON 4 MG PO TBDP
4.0000 mg | ORAL_TABLET | Freq: Once | ORAL | Status: AC
  Administered 2017-07-30: 4 mg via ORAL

## 2017-07-30 NOTE — Discharge Instructions (Signed)
Toradol injection in office for pain. Zofran for nausea and vomiting as needed. Bentyl for stomach cramping. Keep hydrated, you urine should be clear to pale yellow in color. Bland diet as attached, advance as tolerated. Monitor for any worsening of symptoms, nausea or vomiting not controlled by medication, worsening abdominal pain, fever, follow-up for reevaluation.

## 2017-07-30 NOTE — ED Provider Notes (Signed)
[facility]    CSN: [id number] Arrival date & time: 07/30/17  1824     History   Chief Complaint Chief Complaint  Patient presents with  . Diarrhea  . Emesis  . Generalized Body Aches    HPI Linda Campbell is a 47 y.o. female.   47 year old female comes in for 1 day history of nausea, vomiting, diarrhea, generalized body aches.  States started having symptoms last night, "I stopped counting the number of episodes after 17" when asked about vomiting.  Vomit is nonbloody, non-bilious.  She has had 2 episodes per hour of diarrhea, denies blood in stool.  No abdominal pain that is aching and cramping that waxes and wanes.  States cold makes it feels better.  States pain can wrap around to the left lower back.  Denies urinary symptoms such as frequency, dysuria, hematuria.  Denies vaginal symptoms such as discharge, itching/pain, spotting. LMP 07/16/2017.  Denies fever, chills, night sweats.      Past Medical History:  Diagnosis Date  . Abnormal Pap smear "1998"  . Herpes   . Trichomonas   . Vaginal yeast infection     Patient Active Problem List   Diagnosis Date Noted  . Abnormal Pap smear   . Vaginal yeast infection   . Herpes   . Contraception 09/30/2011  . 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 CESAREAN SECTION  1998 and 2002  . DILATION AND CURETTAGE OF UTERUS    . WISDOM TOOTH EXTRACTION      OB History    Gravida Para Term Preterm AB Living   6 2 2   2 2    SAB TAB Ectopic Multiple Live Births   1 1             Home Medications    Prior to Admission medications   Medication Sig Start Date End Date Taking? Authorizing Provider  acetaminophen (TYLENOL) 325 MG tablet Take 325 mg by mouth every 6 (six) hours as needed. For pain    [provider]  dicyclomine (BENTYL) 20 MG tablet Take 1 tablet (20 mg total) by mouth 2 (two) times daily. 07/30/17   Cathie Hoops, Amy V, PA-C  misoprostol (CYTOTEC) 200  MCG tablet Take all four tabs by mouth as a single dose. Patient not taking: Reported on 07/30/2017 07/14/11   Harmon Pier, PA-C  ondansetron (ZOFRAN ODT) 4 MG disintegrating tablet Take 1 tablet (4 mg total) by mouth every 8 (eight) hours as needed for nausea or vomiting. 07/30/17   Cathie Hoops, Amy V, PA-C  ondansetron (ZOFRAN) 4 MG tablet Take 1 tablet (4 mg total) by mouth every 6 (six) hours. Patient not taking: Reported on 07/30/2017 01/20/16   Hayden Rasmussen, NP  Prenatal Vit-Fe Fumarate-FA (PRENATAL MULTIVITAMIN) TABS Take 1 tablet by mouth daily.    [provider]  valACYclovir (VALTREX) 500 MG tablet Take 500 mg by mouth 2 (two) times daily.    [provider]    Family History Family History  Problem Relation Age of Onset  . Alzheimer's disease Maternal Grandfather     Social History Social History   Tobacco Use  . Smoking status: Never Smoker  . Smokeless tobacco: Never Used  Substance Use Topics  . Alcohol use: Yes    Comment: social  . Drug use: No     Allergies   Other; Shellfish allergy; and Pollen extract   Review of Systems Review of  Systems  Reason unable to perform ROS: See HPI as above.     Physical Exam Triage Vital Signs ED Triage Vitals  Enc Vitals Group     BP 07/30/17 1907 (!) 151/89     Pulse Rate 07/30/17 1907 64     Resp 07/30/17 1907 18     Temp 07/30/17 1907 99.2 F (37.3 C)     Temp src --      SpO2 07/30/17 1907 100 %     Weight --      Height --      Head Circumference --      Peak Flow --      Pain Score 07/30/17 1909 10     Pain Loc --      Pain Edu? --      Excl. in GC? --    No data found.  Updated Vital Signs BP (!) 151/89   Pulse 64   Temp 99.2 F (37.3 C)   Resp 18   SpO2 100%   Physical Exam  Constitutional: She is oriented to person, place, and time. She appears well-developed and well-nourished. No distress.  HENT:  Head: Normocephalic and atraumatic.  Eyes: Conjunctivae are normal. Pupils are  equal, round, and reactive to light.  Cardiovascular: Normal rate, regular rhythm and normal heart sounds. Exam reveals no gallop and no friction rub.  No murmur heard. Pulmonary/Chest: Effort normal and breath sounds normal. She has no wheezes. She has no rales.  Abdominal: Soft. Bowel sounds are normal. There is no CVA tenderness.  Generalized tenderness to palpation without guarding or rebound.  Musculoskeletal:  No tenderness to palpation of spinous processes, bilateral back. No CVA tenderness  Neurological: She is alert and oriented to person, place, and time.  Skin: Skin is warm and dry.  Psychiatric: She has a normal mood and affect. Her behavior is normal. Judgment normal.     UC Treatments / Results  Labs (all labs ordered are listed, but only abnormal results are displayed) Labs Reviewed  POCT URINALYSIS DIP (DEVICE) - Abnormal; Notable for the following components:      Result Value   Ketones, ur 40 (*)    Hgb urine dipstick SMALL (*)    Protein, ur >=300 (*)    All other components within normal limits  POCT PREGNANCY, URINE    EKG  EKG Interpretation None       Radiology No results found.  Procedures Procedures (including critical care time)  Medications Ordered in UC Medications  ondansetron (ZOFRAN-ODT) disintegrating tablet 4 mg (4 mg Oral Given 07/30/17 2022)  ketorolac (TORADOL) injection 60 mg (60 mg Intramuscular Given 07/30/17 2017)     Initial Impression / Assessment and Plan / UC Course  I have reviewed the triage vital signs and the nursing notes.  Pertinent labs & imaging results that were available during my care of the patient were reviewed by me and considered in my medical decision making (see chart for details).    Discussed with patient no alarming signs on exam. Toradol infection for body aches. Zofran for nausea. Bentyl for stomach cramping. Push fluids. Bland diet, advance as tolerated. Return precautions given.  Patient expresses  understanding and agrees to plan.   Final Clinical Impressions(s) / UC Diagnoses   Final diagnoses:  Nausea vomiting and diarrhea    ED Discharge Orders        Ordered    ondansetron (ZOFRAN ODT) 4 MG disintegrating tablet  Every 8 hours PRN  07/30/17 2004    dicyclomine (BENTYL) 20 MG tablet  2 times daily     07/30/17 2005        Lurline IdolYu, Amy V, PA-C 07/30/17 2122

## 2017-07-30 NOTE — ED Triage Notes (Signed)
Pt states "last night my stomach started aching, then I started vomiting and had the runs, i've been vomiting ever since., my whole body is aching.". Pt c/o abdominal cramping, generalized.

## 2017-12-28 ENCOUNTER — Ambulatory Visit (HOSPITAL_COMMUNITY)
Admission: EM | Admit: 2017-12-28 | Discharge: 2017-12-28 | Disposition: A | Discharge status: Home / Self Care | Payer: BLUE CROSS/BLUE SHIELD | Attending: Family Medicine | Admitting: Family Medicine

## 2017-12-28 ENCOUNTER — Encounter (HOSPITAL_COMMUNITY): Payer: Self-pay

## 2017-12-28 DIAGNOSIS — K529 Noninfective gastroenteritis and colitis, unspecified: Secondary | ICD-10-CM | POA: Diagnosis not present

## 2017-12-28 MED ORDER — ONDANSETRON HCL 4 MG/2ML IJ SOLN
INTRAMUSCULAR | Status: AC
  Filled 2017-12-28: qty 2

## 2017-12-28 MED ORDER — ONDANSETRON HCL 4 MG/2ML IJ SOLN
4.0000 mg | Freq: Once | INTRAMUSCULAR | Status: AC
  Administered 2017-12-28: 4 mg via INTRAVENOUS

## 2017-12-28 MED ORDER — SODIUM CHLORIDE 0.9 % IV BOLUS
1000.0000 mL | Freq: Once | INTRAVENOUS | Status: AC
  Administered 2017-12-28: 1000 mL via INTRAVENOUS

## 2017-12-28 MED ORDER — ONDANSETRON HCL 4 MG PO TABS: 4.0000 mg | ORAL_TABLET | Freq: Four times a day (QID) | ORAL | 0 refills | Status: DC

## 2017-12-28 MED ORDER — ONDANSETRON HCL 4 MG/2ML IJ SOLN: 4.0000 mg | Freq: Once | INTRAMUSCULAR | Status: DC

## 2017-12-28 MED ORDER — DICYCLOMINE HCL 20 MG PO TABS: 20.0000 mg | ORAL_TABLET | Freq: Two times a day (BID) | ORAL | 0 refills | Status: DC

## 2017-12-28 NOTE — ED Provider Notes (Signed)
  [facility]    CSN: [id number] Arrival date & time: 12/28/17  1007   Chief Complaint  Patient presents with  . Abdominal Pain  . Emesis  . Generalized Body Aches     Subjective Linda Campbell is a 47 y.o. female who presents with vomiting and diarrhea Symptoms began 2 d ago Patient has cramping, vomiting, diarrhea, chills and myalgias Patient denies fever Evaluation to date: no Sick contacts: work with people calling out  Past Medical History:  Diagnosis Date  . Abnormal Pap smear "1998"  . Herpes   . Trichomonas   . Vaginal yeast infection        Review of Systems Constitutional:  No fevers or chills Ear/Nose/Mouth/Throat:  No red eyes Gastrointestinal:  As noted in the HPI  Exam BP (!) 154/88 (BP Location: Left Arm)   Pulse 64   Temp 98.6 F (37 C) (Oral)   Resp 20   LMP  (LMP Unknown)   SpO2 98%  General:  well developed, well hydrated, in no apparent distress Skin:  warm, no pallor or diaphoresis, no rashes Throat/Pharynx:  Mucous membranes dry Neck: neck supple without adenopathy, thyromegaly, or masses Lungs:  clear to auscultation, breath sounds equal bilaterally, no respiratory distress, no wheezes Cardio:  regular rate and rhythm without murmurs Abdomen:  abdomen soft, diffuse ttp; bowel sounds normal; no masses or organomegaly; neg Murphy's, Rovsing's, McBurney's, Carnett's Extremities:  no clubbing, cyanosis, or edema Psych: Appropriate judgement/insight  Assessment and Plan  Gastroenteritis  Zofran IV + 1 L NS bolus, pt felt much better. Zofran and Bentyl for home.  Avoid aggravating foods, discussed BRAT diet. Letter given for work stating she may return Th, 8/16 or sooner if feeling better. F/u if symptoms fail to improve, sooner if worsening. The patient voiced understanding and agreement to the plan.    Sharlene DoryWendling, Rakwon Letourneau Paul, DO 12/28/17 1233

## 2017-12-28 NOTE — ED Notes (Signed)
Patient requested med for abdominal cramping. Notified provider.   Patient was given nausea medicine, no medicine for cramping available.

## 2017-12-28 NOTE — ED Notes (Signed)
Patient lying still, quiet.  Patient acknowledges name when called.  ivf infusing without issue.  approx 550 infused at this time.  Patient denies complaints.

## 2017-12-28 NOTE — ED Triage Notes (Signed)
Pt presents with body aches, generalized abdominal pain and vomiting

## 2018-01-04 ENCOUNTER — Encounter: Payer: Self-pay | Admitting: Medical

## 2018-01-04 ENCOUNTER — Ambulatory Visit: Payer: BLUE CROSS/BLUE SHIELD | Admitting: Medical

## 2018-01-04 VITALS — BP 120/76 | HR 68 | Temp 98.3°F | Resp 16 | Ht 62.0 in | Wt 124.6 lb

## 2018-01-04 DIAGNOSIS — Z1231 Encounter for screening mammogram for malignant neoplasm of breast: Secondary | ICD-10-CM

## 2018-01-04 DIAGNOSIS — Z975 Presence of (intrauterine) contraceptive device: Secondary | ICD-10-CM

## 2018-01-04 DIAGNOSIS — R10819 Abdominal tenderness, unspecified site: Secondary | ICD-10-CM | POA: Diagnosis not present

## 2018-01-04 DIAGNOSIS — R112 Nausea with vomiting, unspecified: Secondary | ICD-10-CM | POA: Diagnosis not present

## 2018-01-04 DIAGNOSIS — M791 Myalgia, unspecified site: Secondary | ICD-10-CM

## 2018-01-04 DIAGNOSIS — K529 Noninfective gastroenteritis and colitis, unspecified: Secondary | ICD-10-CM | POA: Diagnosis not present

## 2018-01-04 DIAGNOSIS — R197 Diarrhea, unspecified: Secondary | ICD-10-CM | POA: Diagnosis not present

## 2018-01-04 DIAGNOSIS — Z139 Encounter for screening, unspecified: Secondary | ICD-10-CM | POA: Insufficient documentation

## 2018-01-04 DIAGNOSIS — Z1239 Encounter for other screening for malignant neoplasm of breast: Secondary | ICD-10-CM

## 2018-01-04 LAB — POCT URINE PREGNANCY: PREG TEST UR: NEGATIVE

## 2018-01-04 LAB — POCT URINALYSIS DIP (PROADVANTAGE DEVICE)
Bilirubin, UA: NEGATIVE
Glucose, UA: NEGATIVE mg/dL
Ketones, POC UA: NEGATIVE mg/dL
Leukocytes, UA: NEGATIVE
NITRITE UA: NEGATIVE
PH UA: 6 (ref 5.0–8.0)
Protein Ur, POC: NEGATIVE mg/dL
RBC UA: NEGATIVE
SPECIFIC GRAVITY, URINE: 1.025
UUROB: POSITIVE

## 2018-01-04 NOTE — Progress Notes (Signed)
Subjective: Chief Complaint  Patient presents with  . np follow up    np hospital follow up virus   Here as a new patient.  No recent PCP.  She notes that she was fell bad on 12/28/17, felt overheated, stomach tightening, nausea, ended up going to hospital next day.  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  At ED, was given 2 medications for nausea and vomiting, was given IV fluids for dehydration.   Hasn't seemed to bounce back from ED visit.  Still feels nauseated, still feels week, hot, feels achy in extremities, lightheaded.    Weight is typically 128lb.  In general is very active, walks her dogs, is active on the job as LawyerCNA.  Was in usual state of health prior to last week's virus.   She was worried about her muscles feeling so achy and weak, wants labs today.  She notes no prior mammogram.   Has IUD that was due to come out 12/2016.  Last pap Dr. Debria GarretStringer's a few years ago.  No other aggravating or relieving factors. No other complaint.  Past Medical History:  Diagnosis Date  . Abnormal Pap smear "1998"  . Cataract   . Herpes   . Trichomonas   . Vaginal yeast infection    Current Outpatient Medications on File Prior to Visit  Medication Sig Dispense Refill  . acetaminophen (TYLENOL) 325 MG tablet Take 325 mg by mouth every 6 (six) hours as needed. For pain    . dicyclomine (BENTYL) 20 MG tablet Take 1 tablet (20 mg total) by mouth 2 (two) times daily. (Patient not taking: Reported on 01/04/2018) 20 tablet 0  . ondansetron (ZOFRAN) 4 MG tablet Take 1 tablet (4 mg total) by mouth every 6 (six) hours. (Patient not taking: Reported on 01/04/2018) 12 tablet 0  . valACYclovir (VALTREX) 500 MG tablet Take 500 mg by mouth 2 (two) times daily.     No current facility-administered medications on file prior to visit.    ROS as in subjective    Objective: BP 120/76   Pulse 68   Temp 98.3 F (36.8 C) (Oral)   Resp 16   Ht 5\' 2"  (1.575 m)   Wt 124 lb 9.6 oz (56.5 kg)   LMP  (LMP  Unknown)   SpO2 97%   BMI 22.79 kg/m   General appearence: alert, no distress, WD/WN,  HEENT: normocephalic, sclerae anicteric, TMs pearly, nares patent, no discharge or erythema, pharynx normal Oral cavity: somewhat dry MM, no lesions Neck: supple, no lymphadenopathy, no thyromegaly, no masses Heart: RRR, normal S1, S2, no murmurs Lungs: CTA bilaterally, no wheezes, rhonchi, or rales Abdomen: +bs, soft, non tender, non distended, no masses, no hepatomegaly, no splenomegaly Pulses: 2+ symmetric, upper and lower extremities, normal cap refill Ext: no edema   Assessment: Encounter Diagnoses  Name Primary?  . Gastroenteritis Yes  . Nausea and vomiting, intractability of vomiting not specified, unspecified vomiting type   . Diarrhea, unspecified type   . Myalgia   . Screening for breast cancer   . IUD (intrauterine device) in place   . Abdominal tenderness, rebound tenderness presence not specified, unspecified location     Plan: I reviewed the recent emergency department visit notes.  Her symptoms suggest gastroenteritis.  Discussed timeframe it takes to resolve and that she is only a week out from her initial symptoms.  Advised that it may take a little bit of time for her to completely resolved.  We  discussed brat diet, hydration, and symptoms should gradually improve.  We will check some labs today given her concerns of weakness and myalgias at her request  She has never had a mammogram, gave order for mammogram.  She will call to set this up  I advised she return soon for physical and removal of the IUD that is a year out of date  Call or return if worse in the meantime  Elnora was seen today for np follow up.  Diagnoses and all orders for this visit:  Gastroenteritis -     CBC with Differential/Platelet -     Comprehensive metabolic panel -     CK  Nausea and vomiting, intractability of vomiting not specified, unspecified vomiting type -     CBC with  Differential/Platelet -     Comprehensive metabolic panel -     CK -     POCT urine pregnancy  Diarrhea, unspecified type -     CBC with Differential/Platelet -     Comprehensive metabolic panel -     CK  Myalgia -     CK  Screening for breast cancer -     MM DIGITAL SCREENING BILATERAL; Future  IUD (intrauterine device) in place  Abdominal tenderness, rebound tenderness presence not specified, unspecified location

## 2018-01-05 LAB — COMPREHENSIVE METABOLIC PANEL
ALBUMIN: 4.2 g/dL (ref 3.5–5.5)
ALK PHOS: 52 IU/L (ref 39–117)
ALT: 9 IU/L (ref 0–32)
AST: 13 IU/L (ref 0–40)
Albumin/Globulin Ratio: 1.4 (ref 1.2–2.2)
BILIRUBIN TOTAL: 0.5 mg/dL (ref 0.0–1.2)
BUN / CREAT RATIO: 14 (ref 9–23)
BUN: 8 mg/dL (ref 6–24)
CHLORIDE: 103 mmol/L (ref 96–106)
CO2: 20 mmol/L (ref 20–29)
CREATININE: 0.59 mg/dL (ref 0.57–1.00)
Calcium: 9.5 mg/dL (ref 8.7–10.2)
GFR calc Af Amer: 127 mL/min/{1.73_m2} (ref >59)
GFR calc non Af Amer: 110 mL/min/{1.73_m2} (ref >59)
GLUCOSE: 91 mg/dL (ref 65–99)
Globulin, Total: 2.9 g/dL (ref 1.5–4.5)
Potassium: 4.1 mmol/L (ref 3.5–5.2)
Sodium: 138 mmol/L (ref 134–144)
Total Protein: 7.1 g/dL (ref 6.0–8.5)

## 2018-01-05 LAB — CBC WITH DIFFERENTIAL/PLATELET
BASOS ABS: 0 10*3/uL (ref 0.0–0.2)
Basos: 1 %
EOS (ABSOLUTE): 0.3 10*3/uL (ref 0.0–0.4)
Eos: 4 %
HEMOGLOBIN: 12.3 g/dL (ref 11.1–15.9)
Hematocrit: 37.1 % (ref 34.0–46.6)
Immature Grans (Abs): 0 10*3/uL (ref 0.0–0.1)
Immature Granulocytes: 0 %
LYMPHS ABS: 2.6 10*3/uL (ref 0.7–3.1)
LYMPHS: 43 %
MCH: 30.3 pg (ref 26.6–33.0)
MCHC: 33.2 g/dL (ref 31.5–35.7)
MCV: 91 fL (ref 79–97)
MONOCYTES: 4 %
Monocytes Absolute: 0.2 10*3/uL (ref 0.1–0.9)
Neutrophils Absolute: 2.9 10*3/uL (ref 1.4–7.0)
Neutrophils: 48 %
PLATELETS: 247 10*3/uL (ref 150–450)
RBC: 4.06 x10E6/uL (ref 3.77–5.28)
RDW: 14.7 % (ref 12.3–15.4)
WBC: 6 10*3/uL (ref 3.4–10.8)

## 2018-01-05 LAB — CK: CK TOTAL: 128 U/L (ref 24–173)

## 2018-01-24 ENCOUNTER — Telehealth: Payer: Self-pay | Admitting: Medical

## 2018-01-24 NOTE — Telephone Encounter (Signed)
Received requested records from [facility]. Sending back for review.  °

## 2018-01-26 ENCOUNTER — Encounter: Payer: Self-pay | Admitting: Medical

## 2018-01-27 ENCOUNTER — Encounter: Payer: Self-pay | Admitting: Medical

## 2018-02-04 ENCOUNTER — Ambulatory Visit (INDEPENDENT_AMBULATORY_CARE_PROVIDER_SITE_OTHER): Payer: BLUE CROSS/BLUE SHIELD | Admitting: Medical

## 2018-02-04 ENCOUNTER — Encounter: Payer: Self-pay | Admitting: Medical

## 2018-02-04 VITALS — BP 120/70 | HR 71 | Temp 98.4°F | Resp 16 | Ht 62.0 in | Wt 127.4 lb

## 2018-02-04 DIAGNOSIS — Z139 Encounter for screening, unspecified: Secondary | ICD-10-CM | POA: Diagnosis not present

## 2018-02-04 DIAGNOSIS — Z8669 Personal history of other diseases of the nervous system and sense organs: Secondary | ICD-10-CM | POA: Diagnosis not present

## 2018-02-04 DIAGNOSIS — A6000 Herpesviral infection of urogenital system, unspecified: Secondary | ICD-10-CM | POA: Diagnosis not present

## 2018-02-04 DIAGNOSIS — Z0000 Encounter for general adult medical examination without abnormal findings: Secondary | ICD-10-CM

## 2018-02-04 DIAGNOSIS — Z23 Encounter for immunization: Secondary | ICD-10-CM | POA: Diagnosis not present

## 2018-02-04 DIAGNOSIS — Z975 Presence of (intrauterine) contraceptive device: Secondary | ICD-10-CM

## 2018-02-04 DIAGNOSIS — B351 Tinea unguium: Secondary | ICD-10-CM | POA: Insufficient documentation

## 2018-02-04 DIAGNOSIS — Z124 Encounter for screening for malignant neoplasm of cervix: Secondary | ICD-10-CM | POA: Insufficient documentation

## 2018-02-04 LAB — POCT URINALYSIS DIP (PROADVANTAGE DEVICE)
Bilirubin, UA: NEGATIVE
Blood, UA: NEGATIVE
Glucose, UA: NEGATIVE mg/dL
Ketones, POC UA: NEGATIVE mg/dL
NITRITE UA: NEGATIVE
PH UA: 5.5 (ref 5.0–8.0)
PROTEIN UA: NEGATIVE mg/dL
Specific Gravity, Urine: 1.03
UUROB: NEGATIVE

## 2018-02-04 MED ORDER — VALACYCLOVIR HCL 1 G PO TABS: ORAL_TABLET | ORAL | 11 refills | Status: AC

## 2018-02-04 MED ORDER — TERBINAFINE HCL 250 MG PO TABS: 250.0000 mg | ORAL_TABLET | Freq: Every day | ORAL | 1 refills | Status: DC

## 2018-02-04 MED ORDER — ACYCLOVIR 5 % EX OINT: 1.0000 "application " | TOPICAL_OINTMENT | CUTANEOUS | 0 refills | Status: DC

## 2018-02-04 NOTE — Progress Notes (Signed)
Subjective:   HPI  Linda Campbell is a 47 y.o. female who presents for Chief Complaint  Patient presents with  . CPE    non fasting CPE  goes to GYN    Medical care team includes: Verlon Pischke, Kermit Balo, PA-C here for primary care Dentist Eye doctor  Concerns: Pap 1 year ago, [facility]  Will get flu shot free at work next month  Gets frequently genital herpes flare ups.  Wants to try acyclovir ointment  Has longstanding discoloration great toenails, white spots, wants treatment for fungus   Reviewed their medical, surgical, family, social, medication, and allergy history and updated chart as appropriate.  Past Medical History:  Diagnosis Date  . Abnormal Pap smear "1998"  . Cataract   . Herpes   . Trichomonas   . Vaginal yeast infection     Past Surgical History:  Procedure Laterality Date  . CATARACT EXTRACTION    . CESAREAN SECTION  1998 and 2002  . DILATION AND CURETTAGE OF UTERUS    . WISDOM TOOTH EXTRACTION      Social History   Socioeconomic History  . Marital status: Single    Spouse name: Not on file  . Number of children: Not on file  . Years of education: Not on file  . Highest education level: Not on file  Occupational History  . Not on file  Social Needs  . Financial resource strain: Not on file  . Food insecurity:    Worry: Not on file    Inability: Not on file  . Transportation needs:    Medical: Not on file    Non-medical: Not on file  Tobacco Use  . Smoking status: Never Smoker  . Smokeless tobacco: Never Used  Substance and Sexual Activity  . Alcohol use: Yes    Comment: social, few times per month  . Drug use: No  . Sexual activity: Yes    Birth control/protection: Condom, IUD    Comment: MIRENA  Lifestyle  . Physical activity:    Days per week: Not on file    Minutes per session: Not on file  . Stress: Not on file  Relationships  . Social connections:    Talks on phone: Not on file    Gets together: Not on  file    Attends religious service: Not on file    Active member of club or organization: Not on file    Attends meetings of clubs or organizations: Not on file    Relationship status: Not on file  . Intimate partner violence:    Fear of current or ex partner: Not on file    Emotionally abused: Not on file    Physically abused: Not on file    Forced sexual activity: Not on file  Other Topics Concern  . Not on file  Social History Narrative   Single, significant other, 2 children, CNA at [employer].    Walks her dogs, goes to park.   01/2018.     Family History  Problem Relation Age of Onset  . Alzheimer's disease Maternal Grandfather   . Hypertension Maternal Aunt   . Cancer Neg Hx   . Heart disease Neg Hx   . Diabetes Neg Hx   . Stroke Neg Hx      Current Outpatient Medications:  .  acetaminophen (TYLENOL) 325 MG tablet, Take 325 mg by mouth every 6 (six) hours as needed. For pain, Disp: , Rfl:  .  acyclovir  ointment (ZOVIRAX) 5 %, Apply 1 application topically every 3 (three) hours., Disp: 30 g, Rfl: 0 .  terbinafine (LAMISIL) 250 MG tablet, Take 1 tablet (250 mg total) by mouth daily., Disp: 30 tablet, Rfl: 1 .  valACYclovir (VALTREX) 1000 MG tablet, 1 tablet daily for prevention, or 2 tabletes BID x 1 days for flare up, Disp: 45 tablet, Rfl: 11 .  valACYclovir (VALTREX) 500 MG tablet, Take 500 mg by mouth 2 (two) times daily., Disp: , Rfl:   Allergies  Allergen Reactions  . Acyclovir And Related     Stomach aches  . Other     Pt is allergic to anima fur  . Shellfish Allergy   . Pollen Extract     Review of Systems Constitutional: -fever, -chills, -sweats, -unexpected weight change, -decreased appetite, -fatigue Allergy: -sneezing, -itching, -congestion Dermatology: -changing moles, +rash, -lumps ENT: -runny nose, -ear pain, -sore throat, -hoarseness, -sinus pain, -teeth pain, - ringing in ears, -hearing loss, -nosebleeds Cardiology: -chest pain, -palpitations,  -swelling, -difficulty breathing when lying flat, -waking up short of breath Respiratory: -cough, -shortness of breath, -difficulty breathing with exercise or exertion, -wheezing, -coughing up blood Gastroenterology: -abdominal pain, -nausea, -vomiting, -diarrhea, -constipation, -blood in stool, -changes in bowel movement, -difficulty swallowing or eating Hematology: -bleeding, -bruising  Musculoskeletal: -joint aches, -muscle aches, -joint swelling, -back pain, -neck pain, -cramping, -changes in gait Ophthalmology: denies vision changes, eye redness, itching, discharge Urology: -burning with urination, -difficulty urinating, -blood in urine, -urinary frequency, -urgency, -incontinence Neurology: -headache, -weakness, -tingling, -numbness, -memory loss, -falls, -dizziness Psychology: -depressed mood, -agitation, -sleep problems Breast/gyn: -breast tenderness, -discharge, -lumps, -vaginal discharge,- irregular periods, -heavy periods      Objective:  BP 120/70   Pulse 71   Temp 98.4 F (36.9 C) (Oral)   Resp 16   Ht 5\' 2"  (1.575 m)   Wt 127 lb 6.4 oz (57.8 kg)   SpO2 99%   BMI 23.30 kg/m   General appearance: alert, no distress, WD/WN, African American female Skin: tattoo right upper back, some discoloration of bilat great toenails, mildly thickened nails,  no other worrisome lesions HEENT: normocephalic, conjunctiva/corneas normal, sclerae anicteric, PERRLA, EOMi, nares patent, no discharge or erythema, pharynx normal Oral cavity: MMM, tongue normal, teeth in good repair Neck: supple, no lymphadenopathy, no thyromegaly, no masses, normal ROM, no bruits Chest: non tender, normal shape and expansion Heart: RRR, normal S1, S2, no murmurs Lungs: CTA bilaterally, no wheezes, rhonchi, or rales Abdomen: +bs, soft, non tender, non distended, no masses, no hepatomegaly, no splenomegaly, no bruits Back: non tender, normal ROM, no scoliosis Musculoskeletal: upper extremities non tender, no  obvious deformity, normal ROM throughout, lower extremities non tender, no obvious deformity, normal ROM throughout Extremities: no edema, no cyanosis, no clubbing Pulses: 2+ symmetric, upper and lower extremities, normal cap refill Neurological: alert, oriented x 3, CN2-12 intact, strength normal upper extremities and lower extremities, sensation normal throughout, DTRs 2+ throughout, no cerebellar signs, gait normal Psychiatric: normal affect, behavior normal, pleasant  Breast/gyn/rectal - deferred to gynecology    Assessment and Plan :   Encounter Diagnoses  Name Primary?  . Routine general medical examination at a health care facility Yes  . Genital herpes simplex, unspecified site   . History of cataract   . Screening for condition   . Need for Tdap vaccination   . IUD (intrauterine device) in place   . Screening for cervical cancer   . Onychomycosis     Physical exam - discussed and  counseled on healthy lifestyle, diet, exercise, preventative care, vaccinations, sick and well care, proper use of emergency dept and after hours care, and addressed their concerns.    Health screening: See your eye doctor yearly for routine vision care. See your dentist yearly for routine dental care including hygiene visits twice yearly.  Cancer screening Discussed and advised monthly self breast exams Discussed mammogram referral back to gyn for removal of IUD and insert new IUD, pap smear f/u  Colonoscopy:  Advised she check insurance coverage for colonoscopy  Vaccinations: Advised yearly influenza vaccine, she will get free at work  Counseled on the Tdap (tetanus, diptheria, and acellular pertussis) vaccine.  Vaccine information sheet given. Tdap vaccine given after consent obtained.  Acute issues discussed: none  Separate significant chronic issues discussed:  Recommendations  Begin Valtrex 1 g daily for prevention.  You get a flare up use 2 tablets twice daily for 1 day.  Do  not have sexual activity when having a flareup until is completely cleared up as this is contagious through direct contact with the lesions or intercourse  So practice good hand gene, handwashing to prevent spread of herpetic lesions  You can try a acyclovir ointment topically for the next flareup  And Lamisil tablet daily for toenail fungus.  You will need to take this for 4 to 6 weeks.  Let us plan on rechecking in 1 month because we will need to recheck your liver test  We will refer you back to [facility] gynecology for removal and reinsertion of IUD  Call and schedule your mammogram  Get your flu shot at work and have them send [place] a copy of the documentation so we can have it on file  Call your insurance company to see if they will cover you for colonoscopy at this stage   Demani was seen today for cpe.  Diagnoses and all orders for this visit:  Routine general medical examination at a health care facility -     POCT Urinalysis DIP (Proadvantage Device) -     TSH -     Lipid panel -     Hepatitis B surface antibody,quantitative -     Hepatitis B surface antigen  Genital herpes simplex, unspecified site  History of cataract  Screening for condition -     Hepatitis B surface antibody,quantitative -     Hepatitis B surface antigen  Need for Tdap vaccination -     Tdap vaccine greater than or equal to 7yo IM  IUD (intrauterine device) in place  Screening for cervical cancer -     Ambulatory referral to Gynecology  Onychomycosis  Other orders -     valACYclovir (VALTREX) 1000 MG tablet; 1 tablet daily for prevention, or 2 tabletes BID x 1 days for flare up -     acyclovir ointment (ZOVIRAX) 5 %; Apply 1 application topically every 3 (three) hours. -     terbinafine (LAMISIL) 250 MG tablet; Take 1 tablet (250 mg total) by mouth daily.    Follow-up pending labs, yearly for physical

## 2018-02-04 NOTE — Patient Instructions (Signed)
Recommendations  Begin Valtrex 1 g daily for prevention.  You get a flare up use 2 tablets twice daily for 1 day.  Do not have sexual activity when having a flareup until is completely cleared up as this is contagious through direct contact with the lesions or intercourse  So practice good hand gene, handwashing to prevent spread of herpetic lesions  You can try a acyclovir ointment topically for the next flareup  And Lamisil tablet daily for toenail fungus.  You will need to take this for 4 to 6 weeks.  Let us plan on rechecking in 1 month because we will need to recheck your liver test  [facility] for removal and reinsertion of IUD  Call and schedule your mammogram  Get your flu shot at work and have them send us a copy of the documentation so we can have it on file  Call your insurance company to see if they will cover you for colonoscopy at this stage

## 2018-02-05 LAB — LIPID PANEL
CHOL/HDL RATIO: 3.4 ratio (ref 0.0–4.4)
CHOLESTEROL TOTAL: 248 mg/dL — AB (ref 100–199)
HDL: 73 mg/dL (ref >39)
LDL Calculated: 159 mg/dL — ABNORMAL HIGH (ref 0–99)
TRIGLYCERIDES: 78 mg/dL (ref 0–149)
VLDL Cholesterol Cal: 16 mg/dL (ref 5–40)

## 2018-02-05 LAB — HEPATITIS B SURFACE ANTIGEN: Hepatitis B Surface Ag: NEGATIVE

## 2018-02-05 LAB — HEPATITIS B SURFACE ANTIBODY, QUANTITATIVE: Hepatitis B Surf Ab Quant: <3.1 m[IU]/mL — ABNORMAL LOW (ref >9.9)

## 2018-02-05 LAB — TSH: TSH: 0.618 u[IU]/mL (ref 0.450–4.500)

## 2018-02-23 ENCOUNTER — Encounter: Payer: Self-pay | Admitting: Family Medicine

## 2018-07-18 ENCOUNTER — Ambulatory Visit (HOSPITAL_COMMUNITY)
Admission: EM | Admit: 2018-07-18 | Discharge: 2018-07-18 | Disposition: A | Discharge status: Home / Self Care | Payer: BLUE CROSS/BLUE SHIELD | Attending: Family Medicine | Admitting: Family Medicine

## 2018-07-18 ENCOUNTER — Encounter (HOSPITAL_COMMUNITY): Payer: Self-pay | Admitting: Emergency Medicine

## 2018-07-18 DIAGNOSIS — H1032 Unspecified acute conjunctivitis, left eye: Secondary | ICD-10-CM

## 2018-07-18 MED ORDER — POLYMYXIN B-TRIMETHOPRIM 10000-0.1 UNIT/ML-% OP SOLN: 1.0000 [drp] | OPHTHALMIC | 0 refills | Status: AC

## 2018-07-18 NOTE — Discharge Instructions (Addendum)
We are treating you for conjunctivitis or pinkeye 1 drop in the left eye every 4 hours while awake You will do this for approximately 7 days If it starts spreading to the other eye you can go ahead and start doing drops in the right eye.

## 2018-07-18 NOTE — ED Provider Notes (Signed)
[facility]    CSN: [id number] Arrival date & time: 07/18/18  1223     History   Chief Complaint Chief Complaint  Patient presents with  . Conjunctivitis    HPI Linda Campbell is a 48 y.o. female.   Patient is a 48 year old female that presents with approximately 2 days of worsening left eye redness, swelling, drainage, itching, irritation. Symptoms have been constant.  She has not done anything to treat the symptoms.  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  Denies any trouble with her vision or severe eye pain.  Denies any cough, congestion, rhinorrhea, sneezing.  ROS per HPI      Past Medical History:  Diagnosis Date  . Abnormal Pap smear "1998"  . Cataract   . Herpes   . Trichomonas   . Vaginal yeast infection     Patient Active Problem List   Diagnosis Date Noted  . History of cataract 02/04/2018  . Genital herpes simplex 02/04/2018  . Need for Tdap vaccination 02/04/2018  . Screening for cervical cancer 02/04/2018  . Onychomycosis 02/04/2018  . Screening for condition 01/04/2018  . IUD (intrauterine device) in place 01/04/2018  . Gastroenteritis 01/04/2018  . Abnormal Pap smear   . Vaginal yeast infection   . Herpes   . Contraception 09/30/2011  . 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 CATARACT EXTRACTION    . CESAREAN SECTION  1998 and 2002  . DILATION AND CURETTAGE OF UTERUS    . WISDOM TOOTH EXTRACTION      OB History    Gravida  6   Para  2   Term  2   Preterm      AB  2   Living  2     SAB  1   TAB  1   Ectopic      Multiple      Live Births               Home Medications    Prior to Admission medications   Medication Sig Start Date End Date Taking? Authorizing Provider  acetaminophen (TYLENOL) 325 MG tablet Take 325 mg by mouth every 6 (six) hours as needed. For pain    [provider]  acyclovir ointment (ZOVIRAX) 5 % Apply 1  application topically every 3 (three) hours. 02/04/18   Tysinger, Kermit Balo, PA-C  terbinafine (LAMISIL) 250 MG tablet Take 1 tablet (250 mg total) by mouth daily. 02/04/18   Tysinger, Kermit Balo, PA-C  trimethoprim-polymyxin b (POLYTRIM) ophthalmic solution Place 1 drop into the left eye every 4 (four) hours. 07/18/18   Janace Aris, NP  valACYclovir (VALTREX) 1000 MG tablet 1 tablet daily for prevention, or 2 tabletes BID x 1 days for flare up 02/04/18   Tysinger, Kermit Balo, PA-C  valACYclovir (VALTREX) 500 MG tablet Take 500 mg by mouth 2 (two) times daily.    [provider]    Family History Family History  Problem Relation Age of Onset  . Alzheimer's disease Maternal Grandfather   . Hypertension Maternal Aunt   . Cancer Neg Hx   . Heart disease Neg Hx   . Diabetes Neg Hx   . Stroke Neg Hx     Social History Social History   Tobacco Use  . Smoking status: Never Smoker  . Smokeless tobacco: Never Used  Substance Use Topics  . Alcohol use: Yes  Comment: social, few times per month  . Drug use: No     Allergies   Acyclovir and related; Other; Shellfish allergy; and Pollen extract   Review of Systems Review of Systems   Physical Exam Triage Vital Signs ED Triage Vitals [07/18/18 1255]  Enc Vitals Group     BP (!) 100/56     Pulse Rate 84     Resp 18     Temp 98.5 F (36.9 C)     Temp Source Temporal     SpO2 100 %     Weight      Height      Head Circumference      Peak Flow      Pain Score 2     Pain Loc      Pain Edu?      Excl. in GC?    No data found.  Updated Vital Signs BP (!) 100/56 (BP Location: Left Arm)   Pulse 84   Temp 98.5 F (36.9 C) (Temporal)   Resp 18   SpO2 100%   Visual Acuity Right Eye Distance:   Left Eye Distance:   Bilateral Distance:    Right Eye Near:   Left Eye Near:    Bilateral Near:     Physical Exam Vitals signs and nursing note reviewed.  Constitutional:      General: She is not in acute distress.     Appearance: Normal appearance. She is not ill-appearing, toxic-appearing or diaphoretic.  HENT:     Head: Normocephalic and atraumatic.     Nose: Nose normal.     Mouth/Throat:     Pharynx: Oropharynx is clear.  Eyes:     General:        Left eye: Discharge present.    Extraocular Movements: Extraocular movements intact.     Pupils: Pupils are equal, round, and reactive to light.     Comments: Left eye upper and lower lid swelling with scleral injection.  Thick mucous and glaze over the eye.  Non tender to globe.  Right eye normal.   Neck:     Musculoskeletal: Normal range of motion.  Pulmonary:     Effort: Pulmonary effort is normal.  Musculoskeletal: Normal range of motion.  Skin:    General: Skin is warm and dry.  Neurological:     Mental Status: She is alert.  Psychiatric:        Mood and Affect: Mood normal.      UC Treatments / Results  Labs (all labs ordered are listed, but only abnormal results are displayed) Labs Reviewed - No data to display  EKG None  Radiology No results found.  Procedures Procedures (including critical care time)  Medications Ordered in UC Medications - No data to display  Initial Impression / Assessment and Plan / UC Course  I have reviewed the triage vital signs and the nursing notes.  Pertinent labs & imaging results that were available during my care of the patient were reviewed by me and considered in my medical decision making (see chart for details).     Symptoms consistent with bacterial conjunctivitis.  Will treat with Polytrim every 4 hours while awake.  Follow up as needed for continued or worsening symptoms  Final Clinical Impressions(s) / UC Diagnoses   Final diagnoses:  Acute bacterial conjunctivitis of left eye     Discharge Instructions     We are treating you for conjunctivitis or pinkeye 1 drop in the left  eye every 4 hours while awake You will do this for approximately 7 days If it starts spreading  to the other eye you can go ahead and start doing drops in the right eye.     ED Prescriptions    Medication Sig Dispense Auth. Provider   trimethoprim-polymyxin b (POLYTRIM) ophthalmic solution Place 1 drop into the left eye every 4 (four) hours. 10 mL Dahlia Byes A, NP     Controlled Substance Prescriptions  Controlled Substance Registry consulted? Not Applicable   Janace Aris, NP 07/18/18 1353

## 2018-07-18 NOTE — ED Triage Notes (Signed)
Pt here for left eye redness, irritation and drainage

## 2019-07-15 ENCOUNTER — Ambulatory Visit (HOSPITAL_COMMUNITY)
Admission: EM | Admit: 2019-07-15 | Discharge: 2019-07-15 | Disposition: A | Discharge status: Home / Self Care | Payer: BLUE CROSS/BLUE SHIELD | Attending: Family Medicine | Admitting: Family Medicine

## 2019-07-15 DIAGNOSIS — K529 Noninfective gastroenteritis and colitis, unspecified: Secondary | ICD-10-CM | POA: Diagnosis not present

## 2019-07-15 DIAGNOSIS — R030 Elevated blood-pressure reading, without diagnosis of hypertension: Secondary | ICD-10-CM

## 2019-07-15 MED ORDER — ONDANSETRON 4 MG PO TBDP
ORAL_TABLET | ORAL | Status: AC
  Filled 2019-07-15: qty 1

## 2019-07-15 MED ORDER — SODIUM CHLORIDE 0.9 % IV BOLUS
1000.0000 mL | Freq: Once | INTRAVENOUS | Status: AC
  Administered 2019-07-15: 1000 mL via INTRAVENOUS

## 2019-07-15 MED ORDER — ONDANSETRON 4 MG PO TBDP
4.0000 mg | ORAL_TABLET | Freq: Once | ORAL | Status: AC
  Administered 2019-07-15: 4 mg via ORAL

## 2019-07-15 MED ORDER — METOCLOPRAMIDE HCL 5 MG/ML IJ SOLN
10.0000 mg | Freq: Once | INTRAMUSCULAR | Status: AC
  Administered 2019-07-15: 10 mg via INTRAVENOUS

## 2019-07-15 MED ORDER — ONDANSETRON 4 MG PO TBDP: 4.0000 mg | ORAL_TABLET | Freq: Three times a day (TID) | ORAL | 0 refills | Status: DC | PRN

## 2019-07-15 MED ORDER — METOCLOPRAMIDE HCL 5 MG/ML IJ SOLN
INTRAMUSCULAR | Status: AC
  Filled 2019-07-15: qty 2

## 2019-07-15 NOTE — ED Triage Notes (Addendum)
Pt present abdominal pain, vomiting, chills and body aches. Symptoms started yesterday after she ate [place] food. Pt has vomited at least 15 times between yesterday in this morning before coming.  Pt has received vaccine for covid 19.

## 2019-07-15 NOTE — Discharge Instructions (Addendum)
Please do your best to ensure adequate fluid intake in order to avoid dehydration. If you find that you are unable to tolerate drinking fluids regularly please proceed to the Emergency Department for evaluation.  Also, you should return to the hospital if you experience fevers, increasing abdominal pain despite medications, persistent vomiting and diarrhea, dizziness, syncope (fainting), or for any other concerns you may find worrisome.  Medications given today: Meds ordered this encounter  Medications   ondansetron (ZOFRAN-ODT) disintegrating tablet 4 mg   sodium chloride 0.9 % bolus 1,000 mL   metoCLOPramide (REGLAN) injection 10 mg   ondansetron (ZOFRAN-ODT) 4 MG disintegrating tablet    Sig: Take 1 tablet (4 mg total) by mouth every 8 (eight) hours as needed for nausea or vomiting.    Dispense:  15 tablet    Refill:  0

## 2019-07-17 NOTE — ED Provider Notes (Addendum)
[facility]   [id number] 07/15/19 Arrival Time: 1018  ASSESSMENT & PLAN:  1. Gastroenteritis   2. Elevated blood pressure reading without diagnosis of hypertension     Feeling a little better after IVF here. Noted increased BP at time of d/c. She may return tomorrow for recheck.  Meds ordered this encounter  Medications  . ondansetron (ZOFRAN-ODT) disintegrating tablet 4 mg  . sodium chloride 0.9 % bolus 1,000 mL  . metoCLOPramide (REGLAN) injection 10 mg  . ondansetron (ZOFRAN-ODT) 4 MG disintegrating tablet    Sig: Take 1 tablet (4 mg total) by mouth every 8 (eight) hours as needed for nausea or vomiting.    Dispense:  15 tablet    Refill:  0    Discussed typical duration of symptoms for suspected viral GI illness. Will do her best to ensure adequate fluid intake in order to avoid dehydration. Will proceed to the Emergency Department for evaluation if unable to tolerate PO fluids regularly.   Reviewed expectations re: course of current medical issues. Questions answered. Outlined signs and symptoms indicating need for more acute intervention. Patient verbalized understanding. After Visit Summary given.   SUBJECTIVE: History from: patient.  Linda Campbell is a 49 y.o. female who presents with complaint of non-bilious, non-bloody intermittent n/v with non-bloody diarrhea. Onset yesterday. Abdominal discomfort: moderate and cramping. Symptoms are unchanged since beginning. Aggravating factors: eating. Alleviating factors: none. Associated symptoms: significant fatigue. She denies belching, dysuria, fever and myalgias. Appetite: decreased. PO intake: decreased. Ambulatory without assistance. Urinary symptoms: none. Sick contacts: none. Recent travel or camping: none. OTC treatment: none.  No LMP recorded. (Menstrual status: IUD).  Past Surgical History:  Procedure Laterality Date  . CATARACT EXTRACTION    . CESAREAN SECTION  1998 and 2002  . DILATION AND  CURETTAGE OF UTERUS    . WISDOM TOOTH EXTRACTION       OBJECTIVE:  Vitals:   07/15/19 1131 07/15/19 1320 07/15/19 1323  BP: (!) 138/49 (!) 183/105 (!) 190/86  Pulse: 81 74   Resp: 18    Temp: 97.7 F (36.5 C)    TempSrc: Oral    SpO2: 100% 100%     General appearance: alert; no distress Oropharynx: somewhat dry Lungs: speaks full sentences without difficulty; unlabored Heart: regular Abdomen: soft; non-distended; no significant abdominal tenderness; reports "cramping" feeling; bowel sounds present; no masses or organomegaly; no guarding or rebound tenderness Back: no CVA tenderness Extremities: no edema; symmetrical with no gross deformities Skin: warm; dry Neurologic: normal gait Psychological: alert and cooperative; normal mood and affect  Labs reviewed by me: Results for orders placed or performed in visit on 02/04/18  TSH  Result Value Ref Range   TSH 0.618 0.450 - 4.500 uIU/mL  Lipid panel  Result Value Ref Range   Cholesterol, Total 248 (H) 100 - 199 mg/dL   Triglycerides 78 0 - 149 mg/dL   HDL 73 >32 mg/dL   VLDL Cholesterol Cal 16 5 - 40 mg/dL   LDL Calculated 992 (H) 0 - 99 mg/dL   Chol/HDL Ratio 3.4 0.0 - 4.4 ratio  Hepatitis B surface antibody,quantitative  Result Value Ref Range   Hepatitis B Surf Ab Quant <3.1 (L) Immunity>9.9 mIU/mL  Hepatitis B surface antigen  Result Value Ref Range   Hepatitis B Surface Ag Negative Negative  POCT Urinalysis DIP (Proadvantage Device)  Result Value Ref Range   Color, UA yellow yellow   Clarity, UA cloudy (A) clear   Glucose, UA negative negative  mg/dL   Bilirubin, UA negative negative   Ketones, POC UA negative negative mg/dL   Specific Gravity, Urine 1.030    Blood, UA negative negative   pH, UA 5.5 5.0 - 8.0   Protein Ur, POC negative negative mg/dL   Urobilinogen, Ur negative    Nitrite, UA Negative Negative   Leukocytes, UA Small (1+) (A) Negative     Allergies  Allergen Reactions  . Acyclovir And  Related     Stomach aches  . Other     Pt is allergic to anima fur  . Shellfish Allergy   . Pollen Extract                                                Past Medical History:  Diagnosis Date  . Abnormal Pap smear "1998"  . Cataract   . Herpes   . Trichomonas   . Vaginal yeast infection    Social History   Socioeconomic History  . Marital status: Single    Spouse name: Not on file  . Number of children: Not on file  . Years of education: Not on file  . Highest education level: Not on file  Occupational History  . Not on file  Tobacco Use  . Smoking status: Never Smoker  . Smokeless tobacco: Never Used  Substance and Sexual Activity  . Alcohol use: Yes    Comment: social, few times per month  . Drug use: No  . Sexual activity: Yes    Birth control/protection: Condom, I.U.D.    Comment: MIRENA  Other Topics Concern  . Not on file  Social History Narrative   Single, significant other, 2 children, CNA at [employer].    Walks her dogs, goes to park.   01/2018.    Social Determinants of Health   Financial Resource Strain:   . Difficulty of Paying Living Expenses: Not on file  Food Insecurity:   . Worried About Charity fundraiser in the Last Year: Not on file  . Ran Out of Food in the Last Year: Not on file  Transportation Needs:   . Lack of Transportation (Medical): Not on file  . Lack of Transportation (Non-Medical): Not on file  Physical Activity:   . Days of Exercise per Week: Not on file  . Minutes of Exercise per Session: Not on file  Stress:   . Feeling of Stress : Not on file  Social Connections:   . Frequency of Communication with Friends and Family: Not on file  . Frequency of Social Gatherings with Friends and Family: Not on file  . Attends Religious Services: Not on file  . Active Member of Clubs or Organizations: Not on file  . Attends Archivist Meetings: Not on file  . Marital Status: Not on file  Intimate Partner Violence:   .  Fear of Current or Ex-Partner: Not on file  . Emotionally Abused: Not on file  . Physically Abused: Not on file  . Sexually Abused: Not on file   Family History  Problem Relation Age of Onset  . Alzheimer's disease Maternal Grandfather   . Hypertension Maternal Aunt   . Cancer Neg Hx   . Heart disease Neg Hx   . Diabetes Neg Hx   . Stroke Neg Hx      Vanessa Kick, MD 07/17/19 [phone]  Mardella Layman, MD 07/17/19 [phone]

## 2022-09-30 ENCOUNTER — Ambulatory Visit (HOSPITAL_COMMUNITY)
Admission: EM | Admit: 2022-09-30 | Discharge: 2022-09-30 | Disposition: A | Discharge status: Home / Self Care | Payer: BLUE CROSS/BLUE SHIELD | Attending: Internal Medicine | Admitting: Internal Medicine

## 2022-09-30 ENCOUNTER — Encounter (HOSPITAL_COMMUNITY): Payer: Self-pay

## 2022-09-30 DIAGNOSIS — R42 Dizziness and giddiness: Secondary | ICD-10-CM

## 2022-09-30 DIAGNOSIS — A084 Viral intestinal infection, unspecified: Secondary | ICD-10-CM

## 2022-09-30 LAB — COMPREHENSIVE METABOLIC PANEL
ALT: 37 U/L (ref 0–44)
AST: 42 U/L — ABNORMAL HIGH (ref 15–41)
Albumin: 4.1 g/dL (ref 3.5–5.0)
Alkaline Phosphatase: 65 U/L (ref 38–126)
Anion gap: 15 (ref 5–15)
BUN: 23 mg/dL — ABNORMAL HIGH (ref 6–20)
CO2: 20 mmol/L — ABNORMAL LOW (ref 22–32)
Calcium: 9.8 mg/dL (ref 8.9–10.3)
Chloride: 102 mmol/L (ref 98–111)
Creatinine, Ser: 0.95 mg/dL (ref 0.44–1.00)
GFR, Estimated: >60 mL/min (ref >60)
Glucose, Bld: 111 mg/dL — ABNORMAL HIGH (ref 70–99)
Potassium: 3.2 mmol/L — ABNORMAL LOW (ref 3.5–5.1)
Sodium: 137 mmol/L (ref 135–145)
Total Bilirubin: 1.7 mg/dL — ABNORMAL HIGH (ref 0.3–1.2)
Total Protein: 7.9 g/dL (ref 6.5–8.1)

## 2022-09-30 LAB — CBC WITH DIFFERENTIAL/PLATELET
Abs Immature Granulocytes: 0.05 10*3/uL (ref 0.00–0.07)
Basophils Absolute: 0 10*3/uL (ref 0.0–0.1)
Basophils Relative: 0 %
Eosinophils Absolute: 0 10*3/uL (ref 0.0–0.5)
Eosinophils Relative: 0 %
HCT: 45.5 % (ref 36.0–46.0)
Hemoglobin: 15.8 g/dL — ABNORMAL HIGH (ref 12.0–15.0)
Immature Granulocytes: 0 %
Lymphocytes Relative: 19 %
Lymphs Abs: 2.7 10*3/uL (ref 0.7–4.0)
MCH: 29.8 pg (ref 26.0–34.0)
MCHC: 34.7 g/dL (ref 30.0–36.0)
MCV: 85.8 fL (ref 80.0–100.0)
Monocytes Absolute: 1.1 10*3/uL — ABNORMAL HIGH (ref 0.1–1.0)
Monocytes Relative: 8 %
Neutro Abs: 9.8 10*3/uL — ABNORMAL HIGH (ref 1.7–7.7)
Neutrophils Relative %: 73 %
Platelets: 250 10*3/uL (ref 150–400)
RBC: 5.3 MIL/uL — ABNORMAL HIGH (ref 3.87–5.11)
RDW: 14.6 % (ref 11.5–15.5)
WBC: 13.6 10*3/uL — ABNORMAL HIGH (ref 4.0–10.5)
nRBC: 0 % (ref 0.0–0.2)

## 2022-09-30 MED ORDER — ONDANSETRON 4 MG PO TBDP: 4.0000 mg | ORAL_TABLET | Freq: Three times a day (TID) | ORAL | 0 refills | Status: DC | PRN

## 2022-09-30 MED ORDER — ONDANSETRON HCL 4 MG/2ML IJ SOLN
4.0000 mg | Freq: Once | INTRAMUSCULAR | Status: AC
  Administered 2022-09-30: 4 mg via INTRAVENOUS

## 2022-09-30 MED ORDER — SODIUM CHLORIDE 0.9 % IV BOLUS
1000.0000 mL | Freq: Once | INTRAVENOUS | Status: AC
  Administered 2022-09-30: 1000 mL via INTRAVENOUS

## 2022-09-30 MED ORDER — PANTOPRAZOLE SODIUM 20 MG PO TBEC: 20.0000 mg | DELAYED_RELEASE_TABLET | Freq: Every day | ORAL | 0 refills | Status: AC

## 2022-09-30 MED ORDER — ONDANSETRON HCL 4 MG/2ML IJ SOLN
INTRAMUSCULAR | Status: AC
  Filled 2022-09-30: qty 2

## 2022-09-30 NOTE — ED Triage Notes (Signed)
Patient reports that she has had N/V, abdominal pain, leg cramps, headache, and dizziness x 2 days.  Patient states she took Tylenol 1000 mg yesterday for her leg cramps.

## 2022-09-30 NOTE — Discharge Instructions (Addendum)
Please increase oral fluid intake-Gatorade will help replenish your electrolytes Please take medications as directed If you have worsening symptoms please return to urgent care to be reevaluated.

## 2022-10-05 NOTE — ED Provider Notes (Signed)
[facility]    CSN: [id number] Arrival date & time: 09/30/22  1409      History   Chief Complaint Chief Complaint  Patient presents with   Emesis   Abdominal Pain   Headache   leg cramps   Dizziness    HPI Ayaka A Barbano is a 52 y.o. female comes to the urgent care with 2 days history of nausea, vomiting, abdominal pain and poor oral intake.  Patient's symptoms started insidiously and has been persistent.  She said that this morning when she woke up out of bed she felt very dizzy and almost had a fainting episode.  Since then she has had dizziness with moving around.  She has had poor oral intake because of the nausea and vomiting.  She has generalized abdominal pain with no abdominal distention.  No known relieving factors.  She denies any diarrhea.  No changes in bowel movement.  No sick contacts.  No recent travel.  No changes in dietary habits. 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 Authorizing Provider  ondansetron (ZOFRAN-ODT) 4 MG disintegrating tablet Take 1 tablet (4 mg total) by mouth every 8 (eight) hours as needed for nausea or vomiting. 09/30/22  Yes Mariam Helbert, Britta Mccreedy, MD  pantoprazole (PROTONIX) 20 MG tablet Take 1 tablet (20 mg total) by mouth daily. 09/30/22  Yes Matilynn Dacey, Britta Mccreedy, MD  acetaminophen (TYLENOL) 325 MG tablet Take 325 mg by mouth every 6 (six) hours as needed. For pain    [provider]  trimethoprim-polymyxin b (POLYTRIM) ophthalmic solution Place 1 drop into the left eye every 4 (four) hours. 07/18/18   Janace Aris, NP  valACYclovir (VALTREX) 1000 MG tablet 1 tablet daily for prevention, or 2 tabletes BID x 1 days for flare up 02/04/18   Tysinger, Kermit Balo, PA-C  valACYclovir (VALTREX) 500 MG tablet Take 500 mg by mouth 2 (two) times daily.    [provider]    Family History Family History  Problem Relation Age of Onset   Alzheimer's  disease Maternal Grandfather    Hypertension Maternal Aunt    Cancer Neg Hx    Heart disease Neg Hx    Diabetes Neg Hx    Stroke Neg Hx     Social History Social History   Tobacco Use   Smoking status: Never   Smokeless tobacco: Never  Vaping Use   Vaping Use: Never used  Substance Use Topics   Alcohol use: Yes    Comment: social, few times per month   Drug use: No     Allergies   Acyclovir and related, Other, Shellfish allergy, and Pollen extract   Review of Systems Review of Systems As per HPI  Physical Exam Triage Vital Signs ED Triage Vitals  Enc Vitals Group     BP 09/30/22 1507 115/82     Pulse Rate 09/30/22 1507 (!) 116     Resp 09/30/22 1507 16     Temp 09/30/22 1507 99 F (37.2 C)     Temp Source 09/30/22 1507 Oral     SpO2 09/30/22 1507 95 %     Weight --      Height --      Head Circumference --      Peak Flow --      Pain Score 09/30/22 1509 6     Pain Loc --      Pain Edu? --      Excl. in GC? --    No  data found.  Updated Vital Signs BP 115/82 (BP Location: Right Arm)   Pulse (!) 116   Temp 99 F (37.2 C) (Oral)   Resp 16   SpO2 95%   Visual Acuity Right Eye Distance:   Left Eye Distance:   Bilateral Distance:    Right Eye Near:   Left Eye Near:    Bilateral Near:     Physical Exam Vitals and nursing note reviewed.  Constitutional:      Appearance: She is ill-appearing.  Cardiovascular:     Rate and Rhythm: Regular rhythm. Tachycardia present.  Pulmonary:     Effort: Pulmonary effort is normal.     Breath sounds: Normal breath sounds.  Abdominal:     General: Bowel sounds are normal.     Palpations: Abdomen is soft. There is no hepatomegaly or splenomegaly.     Tenderness: There is abdominal tenderness in the epigastric area.     Hernia: No hernia is present.  Neurological:     Mental Status: She is alert.      UC Treatments / Results  Labs (all labs ordered are listed, but only abnormal results are displayed) Labs Reviewed  CBC WITH DIFFERENTIAL/PLATELET - Abnormal; Notable for the following components:      Result Value   WBC 13.6 (*)    RBC 5.30 (*)    Hemoglobin 15.8 (*)    Neutro Abs 9.8 (*)    Monocytes Absolute 1.1 (*)    All other components within normal limits  COMPREHENSIVE METABOLIC PANEL - Abnormal; Notable for the following components:   Potassium 3.2 (*)    CO2 20 (*)    Glucose, Bld 111 (*)    BUN 23 (*)    AST 42 (*)    Total Bilirubin 1.7 (*)    All other components within normal limits    EKG   Radiology No results found.  Procedures Procedures (including critical care time)  Medications Ordered in UC Medications  sodium chloride 0.9 % bolus 1,000 mL (0  mLs Intravenous Stopped 09/30/22 1627)  ondansetron (ZOFRAN) injection 4 mg (4 mg Intravenous Given 09/30/22 1535)    Initial Impression / Assessment and Plan / UC Course  I have reviewed the triage vital signs and the nursing notes.  Pertinent labs & imaging results  that were available during my care of the patient were reviewed by me and considered in my medical decision making (see chart for details).     1.  Viral gastroenteritis: Patient is advised to increase oral fluid intake Zofran as needed for nausea/vomiting Protonix 20 mg orally daily CBC, CMP. Return precautions given  2.  Orthostatic dizziness: Patient is advised to increase oral fluid intake Normal saline 1 L fluid bolus was given.  IV fluids were infused over 38 minutes Patient felt better after the IV fluids Return precautions given. Fall precautions given. Final Clinical Impressions(s) / UC Diagnoses   Final diagnoses:  Viral gastroenteritis  Orthostatic dizziness     Discharge Instructions      Please increase oral fluid intake-Gatorade will help replenish your electrolytes Please take medications as directed If you have worsening symptoms please return to urgent care to be reevaluated.   ED Prescriptions     Medication Sig Dispense Auth. Provider   ondansetron (ZOFRAN-ODT) 4 MG disintegrating tablet Take 1 tablet (4 mg total) by mouth every 8 (eight) hours as needed for nausea or vomiting. 20 tablet Narciso Stoutenburg, Britta Mccreedy, MD   pantoprazole (PROTONIX) 20 MG tablet Take 1 tablet (20 mg total) by mouth daily. 14 tablet Evellyn Tuff, Britta Mccreedy, MD      PDMP not reviewed this encounter.   Merrilee Jansky, MD 10/05/22 [phone]

## 2023-04-23 ENCOUNTER — Ambulatory Visit: Payer: Self-pay | Admitting: Nurse Practitioner

## 2023-04-26 ENCOUNTER — Ambulatory Visit: Payer: Self-pay | Admitting: Nurse Practitioner

## 2023-05-17 ENCOUNTER — Ambulatory Visit (INDEPENDENT_AMBULATORY_CARE_PROVIDER_SITE_OTHER): Payer: Self-pay | Admitting: Nurse Practitioner

## 2023-05-17 ENCOUNTER — Encounter: Payer: Self-pay | Admitting: Nurse Practitioner

## 2023-05-17 VITALS — BP 135/84 | HR 79 | Temp 97.1°F | Ht 60.0 in | Wt 124.0 lb

## 2023-05-17 DIAGNOSIS — Z1329 Encounter for screening for other suspected endocrine disorder: Secondary | ICD-10-CM

## 2023-05-17 DIAGNOSIS — Z130 Encounter for screening for diseases of the blood and blood-forming organs and certain disorders involving the immune mechanism: Secondary | ICD-10-CM

## 2023-05-17 DIAGNOSIS — Z0000 Encounter for general adult medical examination without abnormal findings: Secondary | ICD-10-CM | POA: Insufficient documentation

## 2023-05-17 DIAGNOSIS — Z1321 Encounter for screening for nutritional disorder: Secondary | ICD-10-CM

## 2023-05-17 DIAGNOSIS — Z13228 Encounter for screening for other metabolic disorders: Secondary | ICD-10-CM

## 2023-05-17 DIAGNOSIS — Z124 Encounter for screening for malignant neoplasm of cervix: Secondary | ICD-10-CM

## 2023-05-17 DIAGNOSIS — M79642 Pain in left hand: Secondary | ICD-10-CM | POA: Insufficient documentation

## 2023-05-17 DIAGNOSIS — Z1211 Encounter for screening for malignant neoplasm of colon: Secondary | ICD-10-CM

## 2023-05-17 DIAGNOSIS — M79641 Pain in right hand: Secondary | ICD-10-CM

## 2023-05-17 MED ORDER — IBUPROFEN 600 MG PO TABS: 600.0000 mg | ORAL_TABLET | Freq: Three times a day (TID) | ORAL | 0 refills | Status: AC | PRN

## 2023-05-17 NOTE — Patient Instructions (Signed)
Please consider getting Shingrix and influenza vaccine at local pharmacy.   1. Screening for colon cancer (Primary)  - Cologuard  2. Bilateral hand pain  - DG Hand Complete Left; Future - DG Hand Complete Right; Future - ibuprofen (ADVIL) 600 MG tablet; Take 1 tablet (600 mg total) by mouth every 8 (eight) hours as needed.  Dispense: 30 tablet; Refill: 0 - Arthritis Panel - Vitamin B12  3. Screening for endocrine, nutritional, metabolic and immunity disorder  - HIV Antibody (routine testing w rflx) - Lipid panel - CBC - CMP14+EGFR - Hepatitis C antibody    It is important that you exercise regularly at least 30 minutes 5 times a week as tolerated  Think about what you will eat, plan ahead. Choose " clean, green, fresh or frozen" over canned, processed or packaged foods which are more sugary, salty and fatty. 70 to 75% of food eaten should be vegetables and fruit. Three meals at set times with snacks allowed between meals, but they must be fruit or vegetables. Aim to eat over a 12 hour period , example 7 am to 7 pm, and STOP after  your last meal of the day. Drink water,generally about 64 ounces per day, no other drink is as healthy. Fruit juice is best enjoyed in a healthy way, by EATING the fruit.  [facility] for [facility] we consider it a privelige to serve you.

## 2023-05-17 NOTE — Progress Notes (Signed)
New Patient Office Visit  Subjective:  Patient ID: Linda Campbell, female    DOB: Jul 15, 1970  Age: 52 y.o. MRN: [id number]  CC:  Chief Complaint  Patient presents with   Establish Care    HPI Linda Campbell is a 52 y.o. female  has a past medical history of Abnormal Pap smear ("1998"), Cataract, Herpes, Trichomonas, and Vaginal yeast infection.  Patient presents to establish care and for a CPE.  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  Running warm water over her hands relieves the pain  She is on a general diet and does walking exercises on some days, she generally feels well  Patient is currently uninsured ,patient encouraged to get her flu vaccine and shingles vaccine at the pharmacy, Cologuard ordered to screen for colon cancer, application form for solution for mammogram provided.  She wears reading glasses    Past Medical History:  Diagnosis Date   Abnormal Pap smear "1998"   Cataract    Herpes    Trichomonas    Vaginal yeast infection     Past Surgical History:  Procedure Laterality Date   CATARACT EXTRACTION     CESAREAN SECTION  1998 and 2002   DILATION AND CURETTAGE OF UTERUS     WISDOM TOOTH EXTRACTION      Family History  Problem Relation Age of Onset   Alzheimer's disease Maternal Grandfather    Hypertension Maternal Aunt    Cancer Neg Hx    Heart disease Neg Hx    Diabetes Neg Hx    Stroke Neg Hx     Social History   Socioeconomic History   Marital status: Single    Spouse name: Not on file   Number of children: 2   Years of  education: Not on file   Highest education level: Not on file  Occupational History   Not on file  Tobacco Use   Smoking status: Never   Smokeless tobacco: Never  Vaping Use   Vaping status: Never Used  Substance and Sexual Activity   Alcohol use: Yes    Comment: social, few times per month   Drug use: No   Sexual activity: Yes    Birth control/protection: Condom, I.U.D.    Comment: MIRENA  Other Topics Concern   Not on file  Social History Narrative   Single lives with her 2 children    Social Drivers of Corporate investment banker Strain: Not on file  Food Insecurity: Not on file  Transportation Needs: Not on file  Physical Activity: Not on file  Stress: Not on file  Social Connections: Not on file  Intimate Partner Violence: Not on file    ROS Review of Systems  Constitutional:  Negative for appetite change, chills, fatigue and fever.  HENT:  Negative for congestion, postnasal drip, rhinorrhea and sneezing.   Eyes:  Negative for pain, discharge and itching.  Respiratory:  Negative for cough, shortness of breath and wheezing.   Cardiovascular:  Negative for chest pain, palpitations and leg swelling.  Gastrointestinal:  Negative for abdominal pain, constipation, nausea and vomiting.  Endocrine: Negative for cold intolerance, heat intolerance and polydipsia.  Genitourinary:  Negative for difficulty urinating, dysuria, flank pain and frequency.  Musculoskeletal:  Positive for arthralgias. Negative for back pain, joint swelling and myalgias.  Skin:  Negative for color change, pallor, rash and wound.  Allergic/Immunologic: Negative for food allergies and immunocompromised state.  Neurological:  Positive for numbness. Negative for dizziness, facial asymmetry, weakness and headaches.  Psychiatric/Behavioral:  Negative for behavioral problems, confusion, self-injury and suicidal ideas.     Objective:   Today's Vitals: BP 135/84   Pulse 79   Temp (!) 97.1 F (36.2 C)    Ht 5' (1.524 m)   Wt 124 lb (56.2 kg)   SpO2 100%   BMI 24.22 kg/m  Patient declined a breast exam Physical Exam Vitals and nursing note reviewed. Exam conducted with a chaperone present.  Constitutional:      General: She is not in acute distress.    Appearance: Normal appearance. She is not ill-appearing, toxic-appearing or diaphoretic.  HENT:     Right Ear: Tympanic membrane, ear canal and external ear normal. There is no impacted cerumen.     Left Ear: Tympanic membrane, ear canal and external ear normal. There is no impacted cerumen.     Nose: Nose normal. No congestion or rhinorrhea.     Mouth/Throat:     Mouth: Mucous membranes are moist.     Pharynx: Oropharynx is clear. No oropharyngeal exudate or posterior oropharyngeal erythema.  Eyes:     General: No scleral icterus.       Right eye: No discharge.        Left eye: No discharge.     Extraocular Movements: Extraocular movements intact.     Conjunctiva/sclera: Conjunctivae normal.  Neck:     Vascular: No carotid bruit.  Cardiovascular:     Rate and Rhythm: Normal rate and regular rhythm.     Pulses: Normal pulses.     Heart sounds: Normal heart sounds. No murmur heard.    No friction rub. No gallop.  Pulmonary:     Effort: Pulmonary effort is normal. No respiratory distress.     Breath sounds: Normal breath sounds. No stridor. No wheezing, rhonchi or rales.  Chest:     Chest wall: No tenderness.  Abdominal:     General: Bowel sounds are normal. There is no distension.     Palpations: Abdomen is soft. There is no mass.     Tenderness: There is no abdominal tenderness. There is no right CVA tenderness, left CVA tenderness, guarding or rebound.     Hernia: No hernia is present.  Musculoskeletal:        General: Tenderness present. No swelling, deformity or signs of injury.     Cervical back: Normal range of motion and neck supple. No rigidity or tenderness.     Right lower leg: No edema.     Left lower leg: No  edema.     Comments: Tenderness on palpation of bilateral hands, no redness or swelling noted, skin warm and dry, has palpable radial pulses  Lymphadenopathy:     Cervical: No cervical adenopathy.  Skin:    General: Skin is warm and dry.     Capillary Refill: Capillary refill takes less than 2 seconds.     Coloration: Skin is not jaundiced or pale.     Findings: No bruising, erythema, lesion or rash.  Neurological:  Mental Status: She is alert and oriented to person, place, and time.     Cranial Nerves: No cranial nerve deficit.     Sensory: No sensory deficit.     Motor: No weakness.     Coordination: Coordination normal.     Gait: Gait normal.     Deep Tendon Reflexes: Reflexes normal.  Psychiatric:        Mood and Affect: Mood normal.        Behavior: Behavior normal.        Thought Content: Thought content normal.        Judgment: Judgment normal.     Assessment & Plan:   Problem List Items Addressed This Visit       Other   Screening for cervical cancer   Relevant Orders   Ambulatory referral to Gynecology   Bilateral hand pain   2. Bilateral hand pain Most likely due to arthritis, ibuprofen 600 mg 3 times daily as needed ordered patient encouraged to take medication with food to help prevent GI side effects and alternate with Tylenol as needed  - DG Hand Complete Left; Future - DG Hand Complete Right; Future - ibuprofen (ADVIL) 600 MG tablet; Take 1 tablet (600 mg total) by mouth every 8 (eight) hours as needed.  Dispense: 30 tablet; Refill: 0 - Arthritis Panel - Vitamin B12        Relevant Medications   ibuprofen (ADVIL) 600 MG tablet   Other Relevant Orders   DG Hand Complete Left   DG Hand Complete Right   Arthritis Panel   Vitamin B12   Annual physical exam - Primary   Annual exam as documented.  Counseling done include healthy lifestyle involving committing to 150 minutes of exercise per week, heart healthy diet, and attaining healthy weight. The  importance of adequate sleep also discussed.  Regular use of seat belt and home safety were also discussed . Changes in health habits are decided on by patient with goals and time frames set for achieving them. Immunization and cancer screening  needs are specifically addressed at this visit.      1. Screening for colon cancer (Primary)  - Cologuard   3. Screening for endocrine, nutritional, metabolic and immunity disorder  - HIV Antibody (routine testing w rflx) - Lipid panel - CBC - CMP14+EGFR - Hepatitis C antibody  4. Screening for cervical cancer  - Ambulatory referral to Gynecology         Other Visit Diagnoses       Screening for colon cancer       Relevant Orders   Cologuard     Screening for endocrine, nutritional, metabolic and immunity disorder       Relevant Orders   HIV Antibody (routine testing w rflx)   Lipid panel   CBC   CMP14+EGFR   Hepatitis C antibody       Outpatient Encounter Medications as of 05/17/2023  Medication Sig   acetaminophen (TYLENOL) 325 MG tablet Take 325 mg by mouth every 6 (six) hours as needed. For pain   ibuprofen (ADVIL) 600 MG tablet Take 1 tablet (600 mg total) by mouth every 8 (eight) hours as needed.   valACYclovir (VALTREX) 1000 MG tablet 1 tablet daily for prevention, or 2 tabletes BID x 1 days for flare up   pantoprazole (PROTONIX) 20 MG tablet Take 1 tablet (20 mg total) by mouth daily. (Patient not taking: Reported on 05/17/2023)   trimethoprim-polymyxin b (POLYTRIM) ophthalmic solution Place 1  drop into the left eye every 4 (four) hours. (Patient not taking: Reported on 05/17/2023)   valACYclovir (VALTREX) 500 MG tablet Take 500 mg by mouth 2 (two) times daily. (Patient not taking: Reported on 05/17/2023)   [DISCONTINUED] ondansetron (ZOFRAN-ODT) 4 MG disintegrating tablet Take 1 tablet (4 mg total) by mouth every 8 (eight) hours as needed for nausea or vomiting.   No facility-administered encounter medications on  file as of 05/17/2023.    Follow-up: Return in about 2 months (around 07/16/2023) for hand pain.   Donell Beers, FNP

## 2023-05-17 NOTE — Assessment & Plan Note (Addendum)
2. Bilateral hand pain Most likely due to arthritis, ibuprofen 600 mg 3 times daily as needed ordered patient encouraged to take medication with food to help prevent GI side effects and alternate with Tylenol as needed  - DG Hand Complete Left; Future - DG Hand Complete Right; Future - ibuprofen (ADVIL) 600 MG tablet; Take 1 tablet (600 mg total) by mouth every 8 (eight) hours as needed.  Dispense: 30 tablet; Refill: 0 - Arthritis Panel - Vitamin B12

## 2023-05-17 NOTE — Assessment & Plan Note (Addendum)
Annual exam as documented.  Counseling done include healthy lifestyle involving committing to 150 minutes of exercise per week, heart healthy diet, and attaining healthy weight. The importance of adequate sleep also discussed.  Regular use of seat belt and home safety were also discussed . Changes in health habits are decided on by patient with goals and time frames set for achieving them. Immunization and cancer screening  needs are specifically addressed at this visit.      1. Screening for colon cancer (Primary)  - Cologuard   3. Screening for endocrine, nutritional, metabolic and immunity disorder  - HIV Antibody (routine testing w rflx) - Lipid panel - CBC - CMP14+EGFR - Hepatitis C antibody  4. Screening for cervical cancer  - Ambulatory referral to Gynecology

## 2023-05-18 LAB — ARTHRITIS PANEL
Basophils Absolute: 0 10*3/uL (ref 0.0–0.2)
Basos: 1 %
EOS (ABSOLUTE): 0.4 10*3/uL (ref 0.0–0.4)
Eos: 8 %
Hematocrit: 36.6 % (ref 34.0–46.6)
Hemoglobin: 12.2 g/dL (ref 11.1–15.9)
Immature Grans (Abs): 0 10*3/uL (ref 0.0–0.1)
Immature Granulocytes: 0 %
Lymphocytes Absolute: 2.1 10*3/uL (ref 0.7–3.1)
Lymphs: 45 %
MCH: 29.6 pg (ref 26.6–33.0)
MCHC: 33.3 g/dL (ref 31.5–35.7)
MCV: 89 fL (ref 79–97)
Monocytes Absolute: 0.4 10*3/uL (ref 0.1–0.9)
Monocytes: 9 %
Neutrophils Absolute: 1.7 10*3/uL (ref 1.4–7.0)
Neutrophils: 37 %
Platelets: 228 10*3/uL (ref 150–450)
RBC: 4.12 x10E6/uL (ref 3.77–5.28)
RDW: 13.4 % (ref 11.7–15.4)
Rheumatoid fact SerPl-aCnc: <10 [IU]/mL (ref <14.0)
Sed Rate: 16 mm/h (ref 0–40)
Uric Acid: 3.3 mg/dL (ref 3.0–7.2)
WBC: 4.6 10*3/uL (ref 3.4–10.8)

## 2023-05-18 LAB — LIPID PANEL
Chol/HDL Ratio: 3.6 {ratio} (ref 0.0–4.4)
Cholesterol, Total: 230 mg/dL — ABNORMAL HIGH (ref 100–199)
HDL: 64 mg/dL (ref >39)
LDL Chol Calc (NIH): 142 mg/dL — ABNORMAL HIGH (ref 0–99)
Triglycerides: 134 mg/dL (ref 0–149)
VLDL Cholesterol Cal: 24 mg/dL (ref 5–40)

## 2023-05-18 LAB — CMP14+EGFR
ALT: 9 [IU]/L (ref 0–32)
AST: 14 [IU]/L (ref 0–40)
Albumin: 4.2 g/dL (ref 3.8–4.9)
Alkaline Phosphatase: 73 [IU]/L (ref 44–121)
BUN/Creatinine Ratio: 12 (ref 9–23)
BUN: 8 mg/dL (ref 6–24)
Bilirubin Total: 0.3 mg/dL (ref 0.0–1.2)
CO2: 19 mmol/L — ABNORMAL LOW (ref 20–29)
Calcium: 9.2 mg/dL (ref 8.7–10.2)
Chloride: 105 mmol/L (ref 96–106)
Creatinine, Ser: 0.68 mg/dL (ref 0.57–1.00)
Globulin, Total: 2.9 g/dL (ref 1.5–4.5)
Glucose: 98 mg/dL (ref 70–99)
Potassium: 3.9 mmol/L (ref 3.5–5.2)
Sodium: 139 mmol/L (ref 134–144)
Total Protein: 7.1 g/dL (ref 6.0–8.5)
eGFR: 105 mL/min/{1.73_m2} (ref >59)

## 2023-05-18 LAB — HEPATITIS C ANTIBODY: Hep C Virus Ab: NONREACTIVE

## 2023-05-18 LAB — VITAMIN B12: Vitamin B-12: 578 pg/mL (ref 232–1245)

## 2023-05-18 LAB — HIV ANTIBODY (ROUTINE TESTING W REFLEX): HIV Screen 4th Generation wRfx: NONREACTIVE

## 2023-06-18 ENCOUNTER — Telehealth: Payer: Self-pay

## 2023-06-18 NOTE — Telephone Encounter (Signed)
 Telephoned patient at mobile number. Left a voice message with [facility] (scholarship) contact information.

## 2023-07-07 ENCOUNTER — Telehealth: Payer: Self-pay

## 2023-07-07 NOTE — Telephone Encounter (Signed)
 Telephoned patient at mobile number. Left a voice message with [facility] (scholarship) contact information.

## 2023-07-13 ENCOUNTER — Encounter: Payer: Self-pay | Admitting: Radiology

## 2023-07-19 ENCOUNTER — Ambulatory Visit: Payer: Self-pay | Admitting: Nurse Practitioner

## 2023-10-18 NOTE — Telephone Encounter (Signed)
 Telephoned patient at mobile number. Left a voice message with [facility] (scholarship) contact information.
